# Patient Record
Sex: Female | Born: 1990
Health system: Southern US, Community
[De-identification: ages and names within clinical notes are randomized; demographics above are authoritative.]

## PROBLEM LIST (undated history)

## (undated) ENCOUNTER — Inpatient Hospital Stay (HOSPITAL_COMMUNITY): Payer: Self-pay

## (undated) DIAGNOSIS — Z789 Other specified health status: Secondary | ICD-10-CM

## (undated) DIAGNOSIS — E119 Type 2 diabetes mellitus without complications: Secondary | ICD-10-CM

## (undated) HISTORY — PX: NO PAST SURGERIES: SHX2092

---

## 2016-05-21 ENCOUNTER — Encounter (HOSPITAL_COMMUNITY): Payer: Self-pay | Admitting: Emergency Medicine

## 2016-05-21 ENCOUNTER — Emergency Department (HOSPITAL_COMMUNITY)
Admission: EM | Admit: 2016-05-21 | Discharge: 2016-05-22 | Disposition: A | Discharge status: Home / Self Care | Payer: Self-pay | Attending: Emergency Medicine | Admitting: Emergency Medicine

## 2016-05-21 DIAGNOSIS — K0889 Other specified disorders of teeth and supporting structures: Secondary | ICD-10-CM | POA: Insufficient documentation

## 2016-05-21 DIAGNOSIS — E119 Type 2 diabetes mellitus without complications: Secondary | ICD-10-CM | POA: Insufficient documentation

## 2016-05-21 HISTORY — DX: Type 2 diabetes mellitus without complications: E11.9

## 2016-05-21 NOTE — ED Triage Notes (Signed)
Pt c/o toothaches for 2 days on the right side and has not taken medication because is afraid she may be pregnant and is unsure what she can take. Pt recently had a filling before she moved from [place], states there was an infection but was not given antibiotics.

## 2016-05-22 LAB — PREGNANCY, URINE: PREG TEST UR: NEGATIVE

## 2016-05-22 MED ORDER — HYDROCODONE-ACETAMINOPHEN 5-325 MG PO TABS: 2.0000 | ORAL_TABLET | Freq: Four times a day (QID) | ORAL | 0 refills | Status: DC | PRN

## 2016-05-22 MED ORDER — PENICILLIN V POTASSIUM 500 MG PO TABS: 500.0000 mg | ORAL_TABLET | Freq: Three times a day (TID) | ORAL | 0 refills | Status: AC

## 2016-05-22 MED ORDER — HYDROCODONE-ACETAMINOPHEN 5-325 MG PO TABS
2.0000 | ORAL_TABLET | Freq: Once | ORAL | Status: AC
  Administered 2016-05-22: 2 via ORAL
  Filled 2016-05-22: qty 2

## 2016-05-22 NOTE — ED Provider Notes (Signed)
[facility]-EMERGENCY DEPT Provider Note   CSN: [id number] Arrival date & time: 05/21/16  2221     History   Chief Complaint Chief Complaint  Patient presents with  . Dental Pain    HPI Ashley Payne is a 25 y.o. female with pmh of diabetes who presents with R sided dental pain x 2 days.  Pt recently moved here from [place], she states before she moved her dentist in [place] told her she had two cavities in two teeth on the R side.  Pt has been unable to establish care with a dentist in this area.  She states her pain is moderate, 7/10, and sometimes worse when eating.  Pt able to chew on other side when eating.  Pt denies fevers, drooling, facial swelling, difficulty swallowing, shortness of breath, neck stiffness. Pt states she could be pregnant, she took a pregnancy test which came back "invalid".  Pt has not taken anything for pain because she didn't know if she was pregnant.  HPI  Past Medical History:  Diagnosis Date  . Diabetes mellitus without complication (HCC)     There are no active problems to display for this patient.   History reviewed. No pertinent surgical history.  OB History    No data available       Home Medications    Prior to Admission medications   Medication Sig Start Date End Date Taking? Authorizing Provider  HYDROcodone-acetaminophen (NORCO/VICODIN) 5-325 MG tablet Take 2 tablets by mouth every 6 (six) hours as needed. 05/22/16   Liberty Handylaudia J Gibbons, PA-C  penicillin v potassium (VEETID) 500 MG tablet Take 1 tablet (500 mg total) by mouth 3 (three) times daily. 05/22/16 05/29/16  Liberty Handylaudia J Gibbons, PA-C    Family History No family history on file.  Social History Social History  Substance Use Topics  . Smoking status: Never Smoker  . Smokeless tobacco: Never Used  . Alcohol use No     Allergies   Patient has no allergy information on record.   Review of Systems Review of Systems  Constitutional: Negative for chills and  fever.  HENT: Positive for dental problem. Negative for congestion, drooling, ear pain, facial swelling, sinus pain, sore throat, tinnitus and trouble swallowing.   Respiratory: Negative for cough, choking, chest tightness and shortness of breath.   Cardiovascular: Negative for chest pain.  Gastrointestinal: Negative for abdominal pain and nausea.  Genitourinary: Negative for difficulty urinating.  Musculoskeletal: Negative for arthralgias.  Skin: Negative for rash.  Allergic/Immunologic: Negative for environmental allergies.  Neurological: Negative for speech difficulty and headaches.  Psychiatric/Behavioral: Negative.      Physical Exam Updated Vital Signs BP 127/70 (BP Location: Right Arm)   Pulse 89   Temp 98.4 F (36.9 C) (Oral)   Resp 17   LMP 04/17/2016   SpO2 100%   Physical Exam  Constitutional: She is oriented to person, place, and time. Vital signs are normal. She appears well-developed and well-nourished.  HENT:  Head: Normocephalic and atraumatic.  No tinnitus. Full neck ROM.  No pooling of oral secretions.  No fluctuance or tenderness noted along gum line.  No sublingual swelling. No obvious signs of dental abscess or sublingual enlargement.   Eyes: EOM are normal. Pupils are equal, round, and reactive to light.  Neck: Normal range of motion. No JVD present.  Cardiovascular: Normal rate and regular rhythm.   Pulmonary/Chest: Effort normal and breath sounds normal.  Abdominal: Soft. She exhibits no distension. There is no tenderness.  Musculoskeletal: Normal range of motion.  Lymphadenopathy:    She has no cervical adenopathy.  Neurological: She is alert and oriented to person, place, and time.  Skin: Skin is warm and dry.  Psychiatric: She has a normal mood and affect. Her behavior is normal.  Nursing note and vitals reviewed.    ED Treatments / Results  Labs (all labs ordered are listed, but only abnormal results are displayed) Labs Reviewed  PREGNANCY,  URINE    EKG  EKG Interpretation None       Radiology No results found.  Procedures Procedures (including critical care time)  Medications Ordered in ED Medications  HYDROcodone-acetaminophen (NORCO/VICODIN) 5-325 MG per tablet 2 tablet (2 tablets Oral Given 05/22/16 0109)     Initial Impression / Assessment and Plan / ED Course  I have reviewed the triage vital signs and the nursing notes.  Pertinent labs & imaging results that were available during my care of the patient were reviewed by me and considered in my medical decision making (see chart for details).  Clinical Course    Dental pain associated with dental cary but no signs or symptoms of dental abscess with patient afebrile, non toxic appearing and swallowing secretions well. Exam unconcerning for Ludwig's angina or other deep tissue infection in neck.  Will treat with antibiotic and pain medicine. Urged patient to follow-up with dentist.  I gave patient referral to dentist and stressed the importance of dental follow up for ultimate management of dental pain. Patient voices understanding and is agreeable to plan.  Strict ED return precautions given.  Final Clinical Impressions(s) / ED Diagnoses   Final diagnoses:  Pain, dental    New Prescriptions Discharge Medication List as of 05/22/2016 12:55 AM    START taking these medications   Details  HYDROcodone-acetaminophen (NORCO/VICODIN) 5-325 MG tablet Take 2 tablets by mouth every 6 (six) hours as needed., Starting Thu 05/22/2016, Print    penicillin v potassium (VEETID) 500 MG tablet Take 1 tablet (500 mg total) by mouth 3 (three) times daily., Starting Thu 05/22/2016, Until Thu 05/29/2016, Print         Liberty HandyClaudia J Gibbons, PA-C 05/24/16 2311    Canary Brimhristopher J Tegeler, MD 05/25/16 1114

## 2016-05-22 NOTE — ED Notes (Signed)
Patient d/c;d self care.  F/U and medications reviewed via interpreter.  Patient verbalized understanding.

## 2016-05-22 NOTE — Discharge Instructions (Signed)
Por favor llame al Dr. Russella DarBenitez (dentista) para hacer cita para una evaluacion.    Tome el antibiotico (penicillin v potassiuim) tres veces al dia por los proximos siete dias.    Tome hydrocodone-acetaminophen (norco) cada seis horas para Chief Technology Officerel dolor.  Esta pastilla es para el dolor y puede causar somnolencia asi que por favor no consuma alcohol o maneje cuando tome estas pastillas.

## 2018-05-12 ENCOUNTER — Encounter (HOSPITAL_COMMUNITY): Payer: Self-pay | Admitting: Emergency Medicine

## 2018-05-12 ENCOUNTER — Other Ambulatory Visit: Payer: Self-pay

## 2018-05-12 ENCOUNTER — Emergency Department (HOSPITAL_COMMUNITY)
Admission: EM | Admit: 2018-05-12 | Discharge: 2018-05-13 | Disposition: A | Discharge status: Home / Self Care | Payer: Self-pay | Attending: Emergency Medicine | Admitting: Emergency Medicine

## 2018-05-12 DIAGNOSIS — N926 Irregular menstruation, unspecified: Secondary | ICD-10-CM | POA: Insufficient documentation

## 2018-05-12 DIAGNOSIS — E119 Type 2 diabetes mellitus without complications: Secondary | ICD-10-CM | POA: Insufficient documentation

## 2018-05-12 NOTE — ED Triage Notes (Signed)
Pt requesting pregnancy test, has missed period x 3 mos, home pregnancy tests have been negative.

## 2018-05-12 NOTE — ED Notes (Signed)
Pt reports RLQ pain, hx PCOS but doesn't normally miss periods. Hx miscarriage in May.

## 2018-05-13 LAB — CBC
HEMATOCRIT: 39.1 % (ref 36.0–46.0)
Hemoglobin: 12.5 g/dL (ref 12.0–15.0)
MCH: 29.5 pg (ref 26.0–34.0)
MCHC: 32 g/dL (ref 30.0–36.0)
MCV: 92.2 fL (ref 80.0–100.0)
NRBC: 0 % (ref 0.0–0.2)
PLATELETS: 254 10*3/uL (ref 150–400)
RBC: 4.24 MIL/uL (ref 3.87–5.11)
RDW: 13.2 % (ref 11.5–15.5)
WBC: 8 10*3/uL (ref 4.0–10.5)

## 2018-05-13 LAB — URINALYSIS, ROUTINE W REFLEX MICROSCOPIC
Bilirubin Urine: NEGATIVE
GLUCOSE, UA: NEGATIVE mg/dL
Hgb urine dipstick: NEGATIVE
KETONES UR: NEGATIVE mg/dL
Leukocytes, UA: NEGATIVE
Nitrite: NEGATIVE
PH: 9 — AB (ref 5.0–8.0)
Protein, ur: NEGATIVE mg/dL
Specific Gravity, Urine: 1.015 (ref 1.005–1.030)

## 2018-05-13 LAB — COMPREHENSIVE METABOLIC PANEL
ALBUMIN: 3.9 g/dL (ref 3.5–5.0)
ALK PHOS: 55 U/L (ref 38–126)
ALT: 18 U/L (ref 0–44)
ANION GAP: 10 (ref 5–15)
AST: 18 U/L (ref 15–41)
BILIRUBIN TOTAL: 0.9 mg/dL (ref 0.3–1.2)
BUN: 12 mg/dL (ref 6–20)
CALCIUM: 8.8 mg/dL — AB (ref 8.9–10.3)
CHLORIDE: 105 mmol/L (ref 98–111)
CO2: 22 mmol/L (ref 22–32)
Creatinine, Ser: 0.69 mg/dL (ref 0.44–1.00)
GFR calc non Af Amer: >60 mL/min (ref >60)
GLUCOSE: 101 mg/dL — AB (ref 70–99)
Potassium: 4.1 mmol/L (ref 3.5–5.1)
SODIUM: 137 mmol/L (ref 135–145)
Total Protein: 6.8 g/dL (ref 6.5–8.1)

## 2018-05-13 LAB — LIPASE, BLOOD: Lipase: 29 U/L (ref 11–51)

## 2018-05-13 LAB — I-STAT BETA HCG BLOOD, ED (MC, WL, AP ONLY)

## 2018-05-13 NOTE — ED Provider Notes (Signed)
[facility] Provider Note   CSN: [id number] Arrival date & time: 05/12/18  2312     History   Chief Complaint Chief Complaint  Patient presents with  . Possible Pregnancy  . Abdominal Pain    HPI Ashley Payne is a 27 y.o. female.  Patient presents to the ED with complaint of RLQ abdominal pain that started yesterday. She reports having no menses for the past 3 months but has taken 4 pregnancy tests at home that were negative. No nausea, vomiting, anorexia, diarrhea, vaginal discharge or urinary symptoms. She has no fever.   The history is provided by the patient. No language interpreter was used.  Possible Pregnancy  Associated symptoms include abdominal pain. Pertinent negatives include no chest pain and no shortness of breath.  Abdominal Pain   Pertinent negatives include fever, diarrhea, nausea, vomiting, constipation and dysuria.    Past Medical History:  Diagnosis Date  . Diabetes mellitus without complication (HCC)     There are no active problems to display for this patient.   History reviewed. No pertinent surgical history.   OB History   No obstetric history on file.      Home Medications    Prior to Admission medications   Medication Sig Start Date End Date Taking? Authorizing Provider  HYDROcodone-acetaminophen (NORCO/VICODIN) 5-325 MG tablet Take 2 tablets by mouth every 6 (six) hours as needed. 05/22/16   Liberty HandyGibbons, Claudia J, PA-C    Family History No family history on file.  Social History Social History   Tobacco Use  . Smoking status: Never Smoker  . Smokeless tobacco: Never Used  Substance Use Topics  . Alcohol use: No  . Drug use: Not on file     Allergies   Patient has no known allergies.   Review of Systems Review of Systems  Constitutional: Negative for fever.  Respiratory: Negative for shortness of breath.   Cardiovascular: Negative for chest pain.  Gastrointestinal:  Positive for abdominal pain. Negative for constipation, diarrhea, nausea and vomiting.  Genitourinary: Positive for menstrual problem. Negative for dysuria and vaginal discharge.  Neurological: Negative for weakness and light-headedness.     Physical Exam Updated Vital Signs BP 109/74 (BP Location: Right Arm)   Pulse 73   Temp 98 F (36.7 C) (Oral)   Resp 16   LMP 02/17/2018   SpO2 100%   Physical Exam Constitutional:      Appearance: She is well-developed.  HENT:     Head: Normocephalic.  Neck:     Musculoskeletal: Normal range of motion and neck supple.  Cardiovascular:     Rate and Rhythm: Normal rate and regular rhythm.  Pulmonary:     Effort: Pulmonary effort is normal.     Breath sounds: Normal breath sounds.  Abdominal:     General: Bowel sounds are normal.     Palpations: Abdomen is soft.     Tenderness: There is no abdominal tenderness. There is no guarding or rebound.     Comments: Patient has no producible abdominal tenderness.   Musculoskeletal: Normal range of motion.  Skin:    General: Skin is warm and dry.     Findings: No rash.  Neurological:     Mental Status: She is alert.     Cranial Nerves: No cranial nerve deficit.      ED Treatments / Results  Labs (all labs ordered are listed, but only abnormal results are displayed) Labs Reviewed  COMPREHENSIVE METABOLIC PANEL -  Abnormal; Notable for the following components:      Result Value   Glucose, Bld 101 (*)    Calcium 8.8 (*)    All other components within normal limits  URINALYSIS, ROUTINE W REFLEX MICROSCOPIC - Abnormal; Notable for the following components:   APPearance CLOUDY (*)    pH 9.0 (*)    All other components within normal limits  LIPASE, BLOOD  CBC  I-STAT BETA HCG BLOOD, ED ([facility], [facility], [facility] ONLY)   Results for orders placed or performed during the hospital encounter of 05/12/18  Lipase, blood  Result Value Ref Range   Lipase 29 11 - 51 U/L  Comprehensive metabolic panel    Result Value Ref Range   Sodium 137 135 - 145 mmol/L   Potassium 4.1 3.5 - 5.1 mmol/L   Chloride 105 98 - 111 mmol/L   CO2 22 22 - 32 mmol/L   Glucose, Bld 101 (H) 70 - 99 mg/dL   BUN 12 6 - 20 mg/dL   Creatinine, Ser 8.11 0.44 - 1.00 mg/dL   Calcium 8.8 (L) 8.9 - 10.3 mg/dL   Total Protein 6.8 6.5 - 8.1 g/dL   Albumin 3.9 3.5 - 5.0 g/dL   AST 18 15 - 41 U/L   ALT 18 0 - 44 U/L   Alkaline Phosphatase 55 38 - 126 U/L   Total Bilirubin 0.9 0.3 - 1.2 mg/dL   GFR calc non Af Amer >60 >60 mL/min   GFR calc Af Amer >60 >60 mL/min   Anion gap 10 5 - 15  CBC  Result Value Ref Range   WBC 8.0 4.0 - 10.5 K/uL   RBC 4.24 3.87 - 5.11 MIL/uL   Hemoglobin 12.5 12.0 - 15.0 g/dL   HCT 91.4 78.2 - 95.6 %   MCV 92.2 80.0 - 100.0 fL   MCH 29.5 26.0 - 34.0 pg   MCHC 32.0 30.0 - 36.0 g/dL   RDW 21.3 08.6 - 57.8 %   Platelets 254 150 - 400 K/uL   nRBC 0.0 0.0 - 0.2 %  Urinalysis, Routine w reflex microscopic  Result Value Ref Range   Color, Urine YELLOW YELLOW   APPearance CLOUDY (A) CLEAR   Specific Gravity, Urine 1.015 1.005 - 1.030   pH 9.0 (H) 5.0 - 8.0   Glucose, UA NEGATIVE NEGATIVE mg/dL   Hgb urine dipstick NEGATIVE NEGATIVE   Bilirubin Urine NEGATIVE NEGATIVE   Ketones, ur NEGATIVE NEGATIVE mg/dL   Protein, ur NEGATIVE NEGATIVE mg/dL   Nitrite NEGATIVE NEGATIVE   Leukocytes, UA NEGATIVE NEGATIVE  I-Stat beta hCG blood, ED  Result Value Ref Range   I-stat hCG, quantitative <5.0 <5 mIU/mL   Comment 3            EKG None  Radiology No results found.  Procedures Procedures (including critical care time)  Medications Ordered in ED Medications - No data to display   Initial Impression / Assessment and Plan / ED Course  I have reviewed the triage vital signs and the nursing notes.  Pertinent labs & imaging results that were available during my care of the patient were reviewed by me and considered in my medical decision making (see chart for details).     Patient  with RLQ abdominal pain last night and missed periods x 3 months, reporting negative pregnancy tests at home. She presents tonight because of onset of abdominal pain.  VSS here. Pregnancy test negative, ruling out ectopic pregnancy. She has no reproducible  abdominal tenderness. No leukocytosis, fever, or evidence of UTI. Discussed performing a pelvic exam with the patient. It is not felt necessary given completely nontender exam and normal labs, including negative pregnancy. Feel TOA or PID is unlikely. Patient is comfortable with deferring this exam. Discussed follow up with GYN for further evaluation of missed cycles. Referral made.   Final Clinical Impressions(s) / ED Diagnoses   Final diagnoses:  None   1. menstrual irregularity  ED Discharge Orders    None       Elpidio Anis, PA-C 05/13/18 0740    Ward, Layla Maw, DO 05/13/18 2352

## 2018-05-13 NOTE — ED Notes (Signed)
See downtime charting. 

## 2018-05-17 ENCOUNTER — Telehealth (HOSPITAL_COMMUNITY): Payer: Self-pay | Admitting: Lactation Services

## 2018-05-17 NOTE — Telephone Encounter (Signed)
Returned Patient's call left on Lactation answering machine. Spoke with Pt with assistance of [employer] Phone Spanish Eufaulanterpreter, [place] # [phone]. Pt is not in need of Lactation assistance.   [facility] follow up Gynelogical appt. She was referred to clinic [facility], as she does not currently have a provider.   Message to [facility] to call Pt to schedule follow up appt.

## 2018-05-24 ENCOUNTER — Telehealth: Payer: Self-pay | Admitting: Family Medicine

## 2018-05-24 NOTE — Telephone Encounter (Signed)
Spoke with patient about the appointment date and time.

## 2018-06-10 ENCOUNTER — Encounter: Payer: Self-pay | Admitting: Family Medicine

## 2018-06-15 ENCOUNTER — Encounter (HOSPITAL_COMMUNITY): Payer: Self-pay

## 2018-06-15 ENCOUNTER — Emergency Department (HOSPITAL_COMMUNITY)
Admission: EM | Admit: 2018-06-15 | Discharge: 2018-06-15 | Disposition: A | Discharge status: Home / Self Care | Payer: BLUE CROSS/BLUE SHIELD | Attending: Emergency Medicine | Admitting: Emergency Medicine

## 2018-06-15 DIAGNOSIS — R05 Cough: Secondary | ICD-10-CM | POA: Diagnosis not present

## 2018-06-15 DIAGNOSIS — R059 Cough, unspecified: Secondary | ICD-10-CM

## 2018-06-15 MED ORDER — BENZONATATE 100 MG PO CAPS: 100.0000 mg | ORAL_CAPSULE | Freq: Three times a day (TID) | ORAL | 0 refills | Status: AC

## 2018-06-15 NOTE — Discharge Instructions (Signed)
Compre un expectorante como robitussin o mucinex. Le he recetado pastilla para ayudar con la tos, tambien puede hacer gargaras de sal con agua. Si tiene fiebre tome tylenol. Si tiene dolor de [place], siente que no puede respirar regrese a la sala de Associate Professor.

## 2018-06-15 NOTE — ED Provider Notes (Signed)
[facility]-EMERGENCY DEPT Provider Note   CSN: [id number] Arrival date & time: 06/15/18  1220     History   Chief Complaint Chief Complaint  Patient presents with  . Cough  . Sore Throat    HPI Ashley Payne is a 28 y.o. female.  28 y.o female with no PMH presents to the ED with a chief complaint of sore throat x 1 day.  She reports a sore throat which feels like tickling, along with a dry cough that began yesterday.  Patient has tried DayQuil once and NyQuil once and reports no relieving symptoms at this time.  There is currently a baby in the home who was diagnosed with the flu.  Denies any exacerbating or alleviating factors.  She denies any shortness of breath, chest pain, fevers.     History reviewed. No pertinent past medical history.  There are no active problems to display for this patient.   History reviewed. No pertinent surgical history.   OB History   No obstetric history on file.      Home Medications    Prior to Admission medications   Medication Sig Start Date End Date Taking? Authorizing Provider  benzonatate (TESSALON) 100 MG capsule Take 1 capsule (100 mg total) by mouth every 8 (eight) hours for 5 days. 06/15/18 06/20/18  Claude MangesSoto, Syre Knerr, PA-C  HYDROcodone-acetaminophen (NORCO/VICODIN) 5-325 MG tablet Take 2 tablets by mouth every 6 (six) hours as needed. 05/22/16   Liberty HandyGibbons, Claudia J, PA-C    Family History History reviewed. No pertinent family history.  Social History Social History   Tobacco Use  . Smoking status: Never Smoker  . Smokeless tobacco: Never Used  Substance Use Topics  . Alcohol use: No  . Drug use: Not on file     Allergies   Patient has no known allergies.   Review of Systems Review of Systems  Constitutional: Negative for fever.  HENT: Positive for sore throat.   Respiratory: Positive for cough. Negative for shortness of breath.      Physical Exam Updated Vital Signs BP 134/84 (BP  Location: Left Arm)   Pulse 79   Temp 98.3 F (36.8 C) (Oral)   Resp 17   LMP 05/14/2018   SpO2 100%   Physical Exam Vitals signs and nursing note reviewed.  Constitutional:      General: She is not in acute distress.    Appearance: She is well-developed.  HENT:     Head: Normocephalic and atraumatic.     Mouth/Throat:     Mouth: Mucous membranes are moist.     Pharynx: Uvula midline. Posterior oropharyngeal erythema present. No pharyngeal swelling or oropharyngeal exudate.     Tonsils: No tonsillar exudate.     Comments: Some erythema, no edema, no exudates, no tonsillar abscess present. Eyes:     Pupils: Pupils are equal, round, and reactive to light.  Neck:     Musculoskeletal: Normal range of motion.  Cardiovascular:     Rate and Rhythm: Regular rhythm.     Heart sounds: Normal heart sounds.  Pulmonary:     Effort: Pulmonary effort is normal. No respiratory distress.     Breath sounds: Normal breath sounds.     Comments: Lungs are clear to auscultation no wheezing, rhonchi, rales. Abdominal:     General: Bowel sounds are normal. There is no distension.     Palpations: Abdomen is soft.     Tenderness: There is no abdominal tenderness.  Musculoskeletal:  General: No tenderness or deformity.     Right lower leg: No edema.     Left lower leg: No edema.  Skin:    General: Skin is warm and dry.  Neurological:     Mental Status: She is alert and oriented to person, place, and time.      ED Treatments / Results  Labs (all labs ordered are listed, but only abnormal results are displayed) Labs Reviewed - No data to display  EKG None  Radiology No results found.  Procedures Procedures (including critical care time)  Medications Ordered in ED Medications - No data to display   Initial Impression / Assessment and Plan / ED Course  I have reviewed the triage vital signs and the nursing notes.  Pertinent labs & imaging results that were available during  my care of the patient were reviewed by me and considered in my medical decision making (see chart for details).    Patient presents with a history of cough x1 day.  Denies any previous history of asthma, shortness of breath, chest pain, fevers.  Over-the-counter medication tried without symptomatic relief.  During evaluation lungs are clear to auscultation low suspicion for any pneumonia symptoms began yesterday.  No fevers, low suspicion for any flu patient's vitals are stable, oropharynx is clear with no exudates.  At this time will have patient try some over-the-counter cough suppressant in cough decongestions.  She will also be given a prescription for Tessalon Perles to help with her symptoms.  Return precautions discussed at length with patient in Spanish, no translator was used during this encounter.   Final Clinical Impressions(s) / ED Diagnoses   Final diagnoses:  Cough    ED Discharge Orders         Ordered    benzonatate (TESSALON) 100 MG capsule  Every 8 hours     06/15/18 1252           Claude MangesSoto, Demetri Goshert, PA-C 06/15/18 1255    Derwood KaplanNanavati, Ankit, MD 06/15/18 1701

## 2018-06-15 NOTE — ED Notes (Signed)
Bed: WTR8 Expected date:  Expected time:  Means of arrival:  Comments: 

## 2018-06-15 NOTE — ED Triage Notes (Addendum)
Patient c/o cough and sore throat that started yesterday.  Denies n/v or diarrhea.  A/Ox4

## 2018-06-17 ENCOUNTER — Ambulatory Visit (HOSPITAL_COMMUNITY)
Admission: EM | Admit: 2018-06-17 | Discharge: 2018-06-17 | Disposition: A | Discharge status: Home / Self Care | Payer: BLUE CROSS/BLUE SHIELD | Attending: Emergency Medicine | Admitting: Emergency Medicine

## 2018-06-17 ENCOUNTER — Encounter (HOSPITAL_COMMUNITY): Payer: Self-pay

## 2018-06-17 DIAGNOSIS — J111 Influenza due to unidentified influenza virus with other respiratory manifestations: Secondary | ICD-10-CM | POA: Insufficient documentation

## 2018-06-17 LAB — POCT URINALYSIS DIP (DEVICE)
Bilirubin Urine: NEGATIVE
Glucose, UA: NEGATIVE mg/dL
Leukocytes, UA: NEGATIVE
NITRITE: NEGATIVE
PH: 5.5 (ref 5.0–8.0)
PROTEIN: 30 mg/dL — AB
Specific Gravity, Urine: >=1.03 (ref 1.005–1.030)
UROBILINOGEN UA: 1 mg/dL (ref 0.0–1.0)

## 2018-06-17 LAB — POCT RAPID STREP A: Streptococcus, Group A Screen (Direct): NEGATIVE

## 2018-06-17 MED ORDER — IBUPROFEN 800 MG PO TABS
ORAL_TABLET | ORAL | Status: AC
  Filled 2018-06-17: qty 1

## 2018-06-17 MED ORDER — OSELTAMIVIR PHOSPHATE 75 MG PO CAPS: 75.0000 mg | ORAL_CAPSULE | Freq: Two times a day (BID) | ORAL | 0 refills | Status: DC

## 2018-06-17 MED ORDER — FLUTICASONE PROPIONATE 50 MCG/ACT NA SUSP: 2.0000 | Freq: Every day | NASAL | 0 refills | Status: DC

## 2018-06-17 MED ORDER — IBUPROFEN 800 MG PO TABS
800.0000 mg | ORAL_TABLET | Freq: Once | ORAL | Status: AC
  Administered 2018-06-17: 800 mg via ORAL

## 2018-06-17 MED ORDER — IBUPROFEN 600 MG PO TABS: 600.0000 mg | ORAL_TABLET | Freq: Four times a day (QID) | ORAL | 0 refills | Status: DC | PRN

## 2018-06-17 NOTE — ED Provider Notes (Signed)
HPI  SUBJECTIVE:  Ashley Payne is a 28 y.o. female who presents with 2 days of sore throat, body aches, nasal congestion, rhinorrhea, cough productive of phlegm, mild headache, bilateral ear pain, sinus pain and pressure at night only.  States that she has felt feverish, but does not have a thermometer at home.  She denies postnasal drip, wheezing, chest pain, dyspnea on exertion.  She reports shortness of breath after having a coughing spell only.  No neck stiffness.  No rash.  No vomiting, diarrhea.  She did not get a flu shot this year.  Her stepson currently has the flu.  She tried Tessalon and Tylenol for this with improvement in her symptoms.  No aggravating factors.  She took Tylenol within 4 to 6 hours of evaluation.  No antibiotics in the past month.  She was seen in the ER for this 2 days ago, thought to have a cough, and prescribed Tessalon.  She also reports bilateral ovary pain and dysuria during this time.  No vaginal bleeding, odor, discharge.  No urinary frequency, urgency, cloudy or odorous urine, hematuria.  Past medical history negative for diabetes, hypertension, asthma, smoking, UTI, pyelonephritis.  LMP: December 30.  Denies the possibility being pregnant.  PMD: Dr. Lannette Donath Full.  All history obtained through language line.   History reviewed. No pertinent past medical history.  History reviewed. No pertinent surgical history.  History reviewed. No pertinent family history.  Social History   Tobacco Use  . Smoking status: Never Smoker  . Smokeless tobacco: Never Used  Substance Use Topics  . Alcohol use: No  . Drug use: Not on file    No current facility-administered medications for this encounter.   Current Outpatient Medications:  .  benzonatate (TESSALON) 100 MG capsule, Take 1 capsule (100 mg total) by mouth every 8 (eight) hours for 5 days., Disp: 15 capsule, Rfl: 0 .  fluticasone (FLONASE) 50 MCG/ACT nasal spray, Place 2 sprays into both nostrils  daily., Disp: 16 g, Rfl: 0 .  HYDROcodone-acetaminophen (NORCO/VICODIN) 5-325 MG tablet, Take 2 tablets by mouth every 6 (six) hours as needed., Disp: 12 tablet, Rfl: 0 .  ibuprofen (ADVIL,MOTRIN) 600 MG tablet, Take 1 tablet (600 mg total) by mouth every 6 (six) hours as needed., Disp: 30 tablet, Rfl: 0 .  oseltamivir (TAMIFLU) 75 MG capsule, Take 1 capsule (75 mg total) by mouth 2 (two) times daily. X 5 days, Disp: 10 capsule, Rfl: 0  No Known Allergies   ROS  As noted in HPI.   Physical Exam  BP 123/69 (BP Location: Right Arm)   Pulse (!) 110   Temp (!) 101.4 F (38.6 C) (Oral)   Resp 16   LMP 05/31/2018   SpO2 100%   Constitutional: Well developed, well nourished, no acute distress Eyes: PERRL, EOMI, conjunctiva normal bilaterally HENT: Normocephalic, atraumatic,mucus membranes moist.  TMs normal bilaterally.  Positive extensive clear rhinorrhea, erythematous, swollen turbinates.  No maxillary, frontal sinus tenderness.  Normal oropharynx, normal tonsils without exudates.  No obvious postnasal drip. Neck: Positive anterior cervical lymphadenopathy.  No meningismus. Respiratory: Clear to auscultation bilaterally, no rales, no wheezing, no rhonchi Cardiovascular: Regular tachycardia, no murmurs, no gallops, no rubs GI: Soft, nondistended, normal bowel sounds, no rebound, no guarding.  Mild left lower quadrant tenderness with deep palpation.  No suprapubic, flank tenderness.  Tap table test negative. Back: no CVAT skin: No rash, skin intact Musculoskeletal: No edema, no tenderness, no deformities Neurologic: Alert & oriented x 3,  CN III-XII grossly intact, no motor deficits, sensation grossly intact Psychiatric: Speech and behavior appropriate   ED Course   Medications  ibuprofen (ADVIL,MOTRIN) tablet 800 mg (800 mg Oral Given 06/17/18 1556)    Orders Placed This Encounter  Procedures  . Culture, group A strep (throat)    Standing Status:   Standing    Number of  Occurrences:   1  . Urine culture    Standing Status:   Standing    Number of Occurrences:   1  . POCT rapid strep A [facility])    Standing Status:   Standing    Number of Occurrences:   1  . POCT urinalysis dip (device)    Standing Status:   Standing    Number of Occurrences:   1   Results for orders placed or performed during the hospital encounter of 06/17/18 (from the past 24 hour(s))  POCT rapid strep A [facility])     Status: None   Collection Time: 06/17/18  3:40 PM  Result Value Ref Range   Streptococcus, Group A Screen (Direct) NEGATIVE NEGATIVE  POCT urinalysis dip (device)     Status: Abnormal   Collection Time: 06/17/18  3:59 PM  Result Value Ref Range   Glucose, UA NEGATIVE NEGATIVE mg/dL   Bilirubin Urine NEGATIVE NEGATIVE   Ketones, ur TRACE (A) NEGATIVE mg/dL   Specific Gravity, Urine >=1.030 1.005 - 1.030   Hgb urine dipstick TRACE (A) NEGATIVE   pH 5.5 5.0 - 8.0   Protein, ur 30 (A) NEGATIVE mg/dL   Urobilinogen, UA 1.0 0.0 - 1.0 mg/dL   Nitrite NEGATIVE NEGATIVE   Leukocytes, UA NEGATIVE NEGATIVE   No results found.  ED Clinical Impression  Influenza   ED Assessment/Plan  Rapid strep negative.  However given the dysuria will check a UA.  I suspect the flu.  Giving ibuprofen 800 mg p.o. here for fever.  No evidence of pneumonia, meningitis, otitis, intra-abdominal process, surgical abdomen today.  Home with Tamiflu, ibuprofen 600 mg combined with 1 g of Tylenol together 3 or 4 times a day, push fluids, Mucinex D, Flonase, work note for 3 days.  UA negative for UTI.  Urine concentrated, with trace hematuria, ketones, and protein.  Will send off for culture to confirm absence of UTI.  Plan as above.  Spent 25 minutes with the patient using the language line, performing history, physical, explain medical decision making, treatment plan and plan for follow-up with patient.  Using the language line , discussed labs, MDM, treatment plan, and plan  for follow-up with patient Discussed sn/sx that should prompt return to the ED. patient agrees with plan.   Meds ordered this encounter  Medications  . ibuprofen (ADVIL,MOTRIN) tablet 800 mg  . oseltamivir (TAMIFLU) 75 MG capsule    Sig: Take 1 capsule (75 mg total) by mouth 2 (two) times daily. X 5 days    Dispense:  10 capsule    Refill:  0  . fluticasone (FLONASE) 50 MCG/ACT nasal spray    Sig: Place 2 sprays into both nostrils daily.    Dispense:  16 g    Refill:  0  . ibuprofen (ADVIL,MOTRIN) 600 MG tablet    Sig: Take 1 tablet (600 mg total) by mouth every 6 (six) hours as needed.    Dispense:  30 tablet    Refill:  0    *This clinic note was created using Scientist, clinical (histocompatibility and immunogenetics)Dragon dictation software. Therefore, there may be  occasional mistakes despite careful proofreading.  ?   Domenick Gong, MD 06/18/18 [phone]

## 2018-06-17 NOTE — Discharge Instructions (Addendum)
Your strep was negative and your urine is negative for urinary tract infection.  I am sending your urine off for culture to make absolutely sure that you do not have a urinary tract infection.  We will call you if it comes back positive for a urinary tract infection.  Finish the Tamiflu, take the ibuprofen 600 mg combined with 1 g of Tylenol together 3 or 4 times a day, push fluids, Mucinex D, Flonase.

## 2018-06-17 NOTE — ED Triage Notes (Signed)
Pt present coughing and sore throat, symptoms started 2 days ago.  Pt states she has tried OTC no relief

## 2018-06-18 LAB — URINE CULTURE: CULTURE: NO GROWTH

## 2018-06-20 LAB — CULTURE, GROUP A STREP (THRC)

## 2018-07-07 ENCOUNTER — Ambulatory Visit: Payer: BLUE CROSS/BLUE SHIELD | Attending: Family Medicine | Admitting: Family Medicine

## 2018-07-07 ENCOUNTER — Encounter: Payer: Self-pay | Admitting: Family Medicine

## 2018-07-07 VITALS — BP 103/69 | HR 67 | Temp 98.6°F | Resp 18 | Ht 63.0 in | Wt 157.0 lb

## 2018-07-07 DIAGNOSIS — R5383 Other fatigue: Secondary | ICD-10-CM

## 2018-07-07 DIAGNOSIS — R1013 Epigastric pain: Secondary | ICD-10-CM

## 2018-07-07 MED ORDER — OMEPRAZOLE 40 MG PO CPDR: 40.0000 mg | DELAYED_RELEASE_CAPSULE | Freq: Every day | ORAL | 3 refills | Status: DC

## 2018-07-07 MED FILL — OMEPRAZOLE DR 40 MG CAPSULE: 40 | 30 days supply | Qty: 30 | Fill #0

## 2018-07-07 NOTE — Patient Instructions (Signed)
Enfermedad de reflujo gastroesofgico en los adultos  Gastroesophageal Reflux Disease, Adult  El reflujo gastroesofgico (RGE) ocurre cuando el cido del estmago sube por el tubo que conecta la boca con el estmago (esfago). Normalmente, la comida baja por el esfago y se mantiene en el estmago, donde se la digiere. Cuando una persona tiene RGE, los alimentos y el cido estomacal suelen volver al esfago. Usted puede tener una enfermedad llamada enfermedad de reflujo gastroesofgico (ERGE) si el reflujo:   Sucede a menudo.   Causa sntomas frecuentes o muy intensos.   Causa problemas tales como dao en el esfago.  Cuando esto ocurre, el esfago duele y se hincha (inflama). Con el tiempo, la ERGE puede ocasionar pequeos agujeros (lceras) en el revestimiento del esfago.  Cules son las causas?  Esta afeccin se debe a un problema en el msculo que se encuentra entre el esfago y el estmago. Cuando este msculo est dbil o no es normal, no se cierra correctamente para impedir que los alimentos y el cido regresen del estmago. El msculo puede debilitarse debido a lo siguiente:   El consumo de tabaco.   Embarazo.   Tener cierto tipo de hernia (hernia de hiato).   Consumo de alcohol.   Ciertos alimentos y bebidas, como caf, chocolate, cebollas y menta.  Qu incrementa el riesgo?  Es ms probable que tenga esta afeccin si:   Tiene sobrepeso.   Tiene una enfermedad que afecta el tejido conjuntivo.   Usa antiinflamatorios no esteroideos (AINE).  Cules son los signos o los sntomas?  Los sntomas de esta afeccin incluyen:   Acidez estomacal.   Dificultad o dolor al tragar.   Sensacin de tener un bulto en la garganta.   Sabor amargo en la boca.   Mal aliento.   Tener una gran cantidad de saliva.   Estmago inflamado o con malestar.   Eructos.   Dolor en el pecho. El dolor de pecho puede deberse a distintas afecciones. Asegrese de consultar a su mdico si tiene dolor en el pecho.   Falta  de aire o respiracin ruidosa (sibilancias).   Tos constante (crnica) o durante la noche.   Desgaste de la superficie de los dientes (esmalte dental).   Prdida de peso.  Cmo se trata?  El tratamiento depender de la gravedad de los sntomas. El mdico puede sugerirle lo siguiente:   Cambios en la dieta.   Medicamentos.   Una ciruga.  Siga estas indicaciones en su casa:  Comida y bebida     Siga una dieta como se lo haya indicado el mdico. Es posible que deba evitar alimentos y bebidas, por ejemplo:  ? Caf y t (con o sin cafena).  ? Bebidas que contengan alcohol.  ? Bebidas energticas y deportivas.  ? Bebidas gaseosas y refrescos.  ? Chocolate y cacao.  ? Menta y esencia de menta.  ? Ajo y cebolla.  ? Rbano picante.  ? Alimentos cidos y condimentados. Estos incluyen todos los tipos de pimientos, chile en polvo, curry en polvo, vinagre, salsas picantes y salsa barbacoa.  ? Ctricos y sus jugos, por ejemplo, naranjas, limones y limas.  ? Alimentos que contengan tomate. Estos incluyen salsa roja, chile, salsa picante y pizza con salsa de tomate.  ? Alimentos fritos y grasos. Estos incluyen donas, papas fritas, papitas fritas de bolsa y aderezos con alto contenido de grasa.  ? Carnes con alto contenido de grasa. Estas incluye los perros calientes, chuletas o costillas, embutidos, jamn y tocino.  ?   Productos lcteos ricos en grasas, como leche entera, manteca y queso crema.   Consuma pequeas cantidades de comida con ms frecuencia. Evite consumir porciones abundantes.   Evite beber grandes cantidades de lquidos con las comidas.   Evite comer 2 o 3horas antes de acostarse.   Evite recostarse inmediatamente despus de comer.   No haga ejercicios enseguida despus de comer.  Estilo de vida     No consuma ningn producto que contenga nicotina o tabaco. Estos incluyen cigarrillos, cigarrillos electrnicos y tabaco para mascar. Si necesita ayuda para dejar de fumar, consulte al mdico.   Intente  reducir el nivel de estrs. Si necesita ayuda para hacer esto, consulte al mdico.   Si tiene sobrepeso, baje una cantidad de peso saludable para usted. Consulte a su mdico para bajar de peso de manera segura.  Indicaciones generales   Est atento a cualquier cambio en los sntomas.   Tome los medicamentos de venta libre y los recetados solamente como se lo haya indicado el mdico. No tome aspirina, ibuprofeno ni otros AINE a menos que el mdico lo autorice.   Use ropa holgada. No use nada apretado alrededor de la cintura.   Levante (eleve) la cabecera de la cama aproximadamente 6pulgadas (15cm).   Evite inclinarse si al hacerlo empeoran los sntomas.   Concurra a todas las visitas de seguimiento como se lo haya indicado el mdico. Esto es importante.  Comunquese con un mdico si:   Aparecen nuevos sntomas.   Adelgaza y no sabe por qu.   Tiene problemas para tragar o le duele cuando traga.   Tiene sibilancias o tos persistente.   Los sntomas no mejoran con el tratamiento.   Tiene la voz ronca.  Solicite ayuda inmediatamente si:   Siente dolor en los brazos, el cuello, la mandbula, los dientes o la espalda.   Se siente transpirado, mareado o tiene una sensacin de desvanecimiento.   Siente falta de aire o dolor en el pecho.   Vomita y el vmito tiene un aspecto similar a la sangre o a los posos de caf.   Pierde el conocimiento (se desmaya).   Las deposiciones (heces) son sanguinolentas o negras.   No puede tragar, beber o comer.  Resumen   Si una persona tiene enfermedad de reflujo gastroesofgico (ERGE), los alimentos y el cido estomacal suben al esfago y causan sntomas o problemas tales como dao en el esfago.   El tratamiento depender de la gravedad de los sntomas.   Siga una dieta como se lo haya indicado el mdico.   Tome todos los medicamentos solamente como se lo haya indicado el mdico.  Esta informacin no tiene como fin reemplazar el consejo del mdico. Asegrese de  hacerle al mdico cualquier pregunta que tenga.  Document Released: 06/21/2010 Document Revised: 12/31/2017 Document Reviewed: 12/31/2017  Elsevier Interactive Patient Education  2019 [employer] Inc[employer]

## 2018-07-07 NOTE — Progress Notes (Signed)
Subjective:    Patient ID: Ashley Payne, female    DOB: 1990-08-05, 28 y.o.   MRN: [id number]   Due to a language barrier, patient is accompanied by live interpreter at today's visit  HPI       28 yo female new to the practice who is s/p recent ED visit on 06/15/2018 and 06/17/2018 due to cough, sore throat and ear pain.  Patient was eventually diagnosed with influenza.  Patient states that her cough, sore throat and ear pain have now resolved however she continues to have issues with some mild fatigue.  Patient also with complaint of occasional issues with acid reflux after spicy foods such as salsa.  Patient states that she will occasionally get some mild nausea and burping/belching.  Patient is currently on Zantac 150 mg once daily but she states that this does not seem to control of her symptoms.      On review of systems, patient reports that she has had some constipation.  No blood in the stool and no black stools.  No current abdominal pain.  Patient denies any chest pain or palpitations, no shortness of breath or cough.  No current urinary frequency, urgency or dysuria.  Patient reports that in the emergency department, she was having some lower abdominal cramping and urinalysis was done.  She now believes that that abdominal cramping was secondary to onset of her menses as patient states that she went through a 60-month period where she did not have her menses and then menses restarted and now becoming more regular.  When she was in the emergency department however her menses was 4 days late and this is why urine pregnancy test was done and UA.       Patient reports no known drug, food or insect sting allergies.  Patient reports no significant past medical history other than prior gastritis/heartburn for which she was placed on Zantac 150 mg once daily.  Patient however does not believe that this medication is currently effective enough.  Patient's family history is significant for mother  with asthma, maternal grandmother with breast cancer with onset later in life and dad with history of hypoglycemia.  Patient is married and does not smoke or use tobacco products and does not drink alcohol.   Review of Systems  Constitutional: Positive for fatigue. Negative for chills and fever.  HENT: Negative for congestion, ear pain, postnasal drip, sore throat and trouble swallowing.   Respiratory: Negative for cough and shortness of breath.   Cardiovascular: Negative for chest pain, palpitations and leg swelling.  Gastrointestinal: Positive for constipation and nausea. Negative for abdominal pain, blood in stool and diarrhea.  Endocrine: Negative for polydipsia, polyphagia and polyuria.  Genitourinary: Positive for menstrual problem (patient had absent menses x 4 months but has now restarted and becoming regular again). Negative for dysuria and frequency.  Allergic/Immunologic: Negative for environmental allergies, food allergies and immunocompromised state.  Neurological: Negative for dizziness and headaches.  Hematological: Negative for adenopathy. Does not bruise/bleed easily.       Objective:   Physical Exam BP 103/69 (BP Location: Right Arm, Patient Position: Sitting, Cuff Size: Normal)   Pulse 67   Temp 98.6 F (37 C) (Oral)   Resp 18   Ht 5\' 3"  (1.6 m)   Wt 157 lb (71.2 kg)   LMP 07/01/2018   SpO2 99%   BMI 27.81 kg/m  Nurse's notes and vital signs reviewed General-well-nourished, well-developed female in no acute distress.  Patient  is accompanied by her husband and a live interpreter at today's visit ENT- TM slightly dull, mild edema of the nasal turbinates, normal oropharynx Neck-supple, no lymphadenopathy, no thyromegaly Lungs-clear to auscultation bilaterally Cardiovascular-regular rate and rhythm Abdomen-soft, nontender, no rebound or guarding Back-no CVA tenderness Extremities-no edema       Assessment & Plan:  1. Dyspepsia Patient with dyspepsia for  which she is currently on Zantac without complete relief.  Patient was given handout as part of AVS on GERD.  Patient will be placed on omeprazole 40 mg daily.  Patient should also avoid known trigger foods or foods that are spicy/greasy.  Patient has also been asked to return to clinic in 2 to 3 months for reevaluation and at that time we will likely decrease the omeprazole dose to 20 mg if patient is symptom-free.  Patient should call or return sooner if she is having symptoms or issues tolerating the medication - omeprazole (PRILOSEC) 40 MG capsule; Take 1 capsule (40 mg total) by mouth daily. To decrease stomach acid  Dispense: 30 capsule; Refill: 3  2. Fatigue, unspecified type Patient with complaint of mild fatigue status post influenza.  Patient should make sure that she is well rested and remain well-hydrated.  Patient will have CBC to look for anemia, complete metabolic panel to look for electrolyte or liver enzyme abnormality and TSH and free T4 to look for thyroid abnormality which may be contributing to her fatigue. - CBC with Differential - Comprehensive metabolic panel - TSH + free T4  An After Visit Summary was printed and given to the patient.  Return in about 3 months (around 10/05/2018) for dyspepsia/fatigue.

## 2018-07-08 LAB — COMPREHENSIVE METABOLIC PANEL WITH GFR
ALT: 16 IU/L (ref 0–32)
AST: 17 IU/L (ref 0–40)
Albumin/Globulin Ratio: 1.6 (ref 1.2–2.2)
Albumin: 4.2 g/dL (ref 3.9–5.0)
Alkaline Phosphatase: 72 IU/L (ref 39–117)
BUN/Creatinine Ratio: 17 (ref 9–23)
BUN: 11 mg/dL (ref 6–20)
Bilirubin Total: 0.4 mg/dL (ref 0.0–1.2)
CO2: 23 mmol/L (ref 20–29)
Calcium: 9.3 mg/dL (ref 8.7–10.2)
Chloride: 101 mmol/L (ref 96–106)
Creatinine, Ser: 0.65 mg/dL (ref 0.57–1.00)
GFR calc Af Amer: 141 mL/min/1.73
GFR calc non Af Amer: 122 mL/min/1.73
Globulin, Total: 2.6 g/dL (ref 1.5–4.5)
Glucose: 86 mg/dL (ref 65–99)
Potassium: 3.6 mmol/L (ref 3.5–5.2)
Sodium: 139 mmol/L (ref 134–144)
Total Protein: 6.8 g/dL (ref 6.0–8.5)

## 2018-07-08 LAB — CBC WITH DIFFERENTIAL/PLATELET
Basophils Absolute: 0.1 x10E3/uL (ref 0.0–0.2)
Basos: 1 %
EOS (ABSOLUTE): 0.2 x10E3/uL (ref 0.0–0.4)
Eos: 3 %
Hematocrit: 37.8 % (ref 34.0–46.6)
Hemoglobin: 12.4 g/dL (ref 11.1–15.9)
Immature Grans (Abs): 0.1 x10E3/uL (ref 0.0–0.1)
Immature Granulocytes: 1 %
Lymphocytes Absolute: 1.9 x10E3/uL (ref 0.7–3.1)
Lymphs: 26 %
MCH: 29.9 pg (ref 26.6–33.0)
MCHC: 32.8 g/dL (ref 31.5–35.7)
MCV: 91 fL (ref 79–97)
Monocytes Absolute: 0.6 x10E3/uL (ref 0.1–0.9)
Monocytes: 9 %
Neutrophils Absolute: 4.5 x10E3/uL (ref 1.4–7.0)
Neutrophils: 60 %
Platelets: 307 x10E3/uL (ref 150–450)
RBC: 4.15 x10E6/uL (ref 3.77–5.28)
RDW: 13 % (ref 11.7–15.4)
WBC: 7.3 x10E3/uL (ref 3.4–10.8)

## 2018-07-08 LAB — TSH+FREE T4
Free T4: 1.17 ng/dL (ref 0.82–1.77)
TSH: 2.8 u[IU]/mL (ref 0.450–4.500)

## 2018-07-09 ENCOUNTER — Telehealth: Payer: Self-pay | Admitting: *Deleted

## 2018-07-09 NOTE — Telephone Encounter (Signed)
Medical Assistant used [employer] to contact patient.  Interpreter Name: Dalphine Handing #: [id number] Patient was not available, [employer] left patient a voicemail. Voicemail states to give a call back to [place] with [facility] at [phone]. Please let patient know labs are normal.

## 2018-07-09 NOTE — Telephone Encounter (Signed)
-----   Message from Cain Saupe, MD sent at 07/08/2018 11:35 PM EST ----- Please notify patient that her CBC. CMET and TSH were normal

## 2018-07-19 ENCOUNTER — Telehealth: Payer: Self-pay | Admitting: *Deleted

## 2018-07-19 NOTE — Telephone Encounter (Signed)
Pt name and DOB verified. Patient aware of results and result note per Dr. Jillyn Hidden. Results translated by  [place] for assistance.

## 2018-08-25 ENCOUNTER — Encounter (HOSPITAL_COMMUNITY): Payer: Self-pay | Admitting: Emergency Medicine

## 2018-08-25 ENCOUNTER — Ambulatory Visit (HOSPITAL_COMMUNITY)
Admission: EM | Admit: 2018-08-25 | Discharge: 2018-08-25 | Disposition: A | Discharge status: Home / Self Care | Payer: BLUE CROSS/BLUE SHIELD | Attending: Family Medicine | Admitting: Family Medicine

## 2018-08-25 ENCOUNTER — Other Ambulatory Visit: Payer: Self-pay

## 2018-08-25 DIAGNOSIS — J039 Acute tonsillitis, unspecified: Secondary | ICD-10-CM

## 2018-08-25 MED ORDER — AMOXICILLIN 500 MG PO CAPS: 500.0000 mg | ORAL_CAPSULE | Freq: Two times a day (BID) | ORAL | 0 refills | Status: AC

## 2018-08-25 NOTE — Discharge Instructions (Signed)
Sore Throat  Toma amoxicllin 2 veces cada dia por 10 dias  Please continue Tylenol or Ibuprofen for fever and pain. May try salt water gargles, cepacol lozenges, throat spray, or OTC cold relief medicine for throat discomfort. If you also have congestion take a daily anti-histamine like Zyrtec, Claritin, and a oral decongestant to help with post nasal drip that may be irritating your throat.   Stay hydrated and drink plenty of fluids to keep your throat coated relieve irritation.

## 2018-08-25 NOTE — ED Notes (Signed)
Pt discharged by provider.

## 2018-08-25 NOTE — ED Triage Notes (Signed)
[employer] Interpreter: Byrd Hesselbach [phone]  Pt complains of nasal congestion and sore throat x2 days.   She denies any fever, cough or SOB.  Pt has been gargling with warm salt water.

## 2018-08-26 NOTE — ED Provider Notes (Signed)
[facility]    CSN: [id number] Arrival date & time: 08/25/18  1930     History   Chief Complaint Chief Complaint  Patient presents with  . URI    HPI Ashley Payne is a 28 y.o. female no significant past medical history presenting today for evaluation of sore throat.  Patient states that she has had sore throat for the past couple days.  She mainly feels swelling in her tonsils and her upper neck.  Denies associated nasal congestion, cough.  Denies any fevers.  She has tried salt water gargles, but no over-the-counter medicines. Denies any recent travel, denies any known exposure to COVID-19.  Denies close contacts with similar symptoms.  HPI  History reviewed. No pertinent past medical history.  There are no active problems to display for this patient.   History reviewed. No pertinent surgical history.  OB History   No obstetric history on file.      Home Medications    Prior to Admission medications   Medication Sig Start Date End Date Taking? Authorizing Provider  acetaminophen (TYLENOL) 325 MG tablet Take 650 mg by mouth every 6 (six) hours as needed for mild pain.   Yes [provider]  amoxicillin (AMOXIL) 500 MG capsule Take 1 capsule (500 mg total) by mouth 2 (two) times daily for 10 days. 08/25/18 09/04/18  Wieters, Hallie C, PA-C  ibuprofen (ADVIL,MOTRIN) 600 MG tablet Take 1 tablet (600 mg total) by mouth every 6 (six) hours as needed. 06/17/18   Domenick Gong, MD  omeprazole (PRILOSEC) 40 MG capsule Take 1 capsule (40 mg total) by mouth daily. To decrease stomach acid 07/07/18   Fulp, Hewitt Shorts, MD    Family History History reviewed. No pertinent family history.  Social History Social History   Tobacco Use  . Smoking status: Never Smoker  . Smokeless tobacco: Never Used  Substance Use Topics  . Alcohol use: No  . Drug use: Never     Allergies   Patient has no known allergies.   Review of Systems Review of Systems   Constitutional: Negative for activity change, appetite change, chills, fatigue and fever.  HENT: Positive for sore throat. Negative for congestion, ear pain, rhinorrhea, sinus pressure and trouble swallowing.   Eyes: Negative for discharge and redness.  Respiratory: Negative for cough, chest tightness and shortness of breath.   Cardiovascular: Negative for chest pain.  Gastrointestinal: Negative for abdominal pain, diarrhea, nausea and vomiting.  Musculoskeletal: Negative for myalgias.  Skin: Negative for rash.  Neurological: Negative for dizziness, light-headedness and headaches.     Physical Exam Triage Vital Signs ED Triage Vitals  Enc Vitals Group     BP 08/25/18 2002 (!) 119/54     Pulse Rate 08/25/18 2002 92     Resp --      Temp 08/25/18 2002 99.5 F (37.5 C)     Temp Source 08/25/18 2002 Oral     SpO2 08/25/18 2002 100 %     Weight --      Height --      Head Circumference --      Peak Flow --      Pain Score 08/25/18 2000 4     Pain Loc --      Pain Edu? --      Excl. in GC? --    No data found.  Updated Vital Signs BP (!) 119/54 (BP Location: Left Arm)   Pulse 92   Temp 99.5 F (37.5  C) (Oral)   LMP 08/25/2018 (Exact Date)   SpO2 100%   Visual Acuity Right Eye Distance:   Left Eye Distance:   Bilateral Distance:    Right Eye Near:   Left Eye Near:    Bilateral Near:     Physical Exam Vitals signs and nursing note reviewed.  Constitutional:      General: She is not in acute distress.    Appearance: She is well-developed.  HENT:     Head: Normocephalic and atraumatic.     Ears:     Comments: Bilateral ears without tenderness to palpation of external auricle, tragus and mastoid, EAC's without erythema or swelling, TM's with good bony landmarks and cone of light. Non erythematous.     Nose:     Comments: Nasal mucosa pink without rhinorrhea    Mouth/Throat:     Comments: Bilateral tonsils moderately enlarged, slight erythema with exudate  bilaterally, posterior pharynx patent, no uvula swelling or deviation, no soft palate swelling Eyes:     Conjunctiva/sclera: Conjunctivae normal.  Neck:     Musculoskeletal: Neck supple.  Cardiovascular:     Rate and Rhythm: Normal rate and regular rhythm.     Heart sounds: No murmur.  Pulmonary:     Effort: Pulmonary effort is normal. No respiratory distress.     Breath sounds: Normal breath sounds.     Comments: Breathing comfortably at rest, CTABL, no wheezing, rales or other adventitious sounds auscultated Abdominal:     Palpations: Abdomen is soft.     Tenderness: There is no abdominal tenderness.  Skin:    General: Skin is warm and dry.  Neurological:     Mental Status: She is alert.      UC Treatments / Results  Labs (all labs ordered are listed, but only abnormal results are displayed) Labs Reviewed - No data to display  EKG None  Radiology No results found.  Procedures Procedures (including critical care time)  Medications Ordered in UC Medications - No data to display  Initial Impression / Assessment and Plan / UC Course  I have reviewed the triage vital signs and the nursing notes.  Pertinent labs & imaging results that were available during my care of the patient were reviewed by me and considered in my medical decision making (see chart for details).     Patient with low-grade fever and tonsillitis, will treat as such with amoxicillin twice daily x10 days.  Discussed further symptomatic and supportive care.  Continue to monitor,Discussed strict return precautions. Patient verbalized understanding and is agreeable with plan.  Final Clinical Impressions(s) / UC Diagnoses   Final diagnoses:  Acute tonsillitis, unspecified etiology     Discharge Instructions     Sore Throat  Toma amoxicllin 2 veces cada dia por 10 dias  Please continue Tylenol or Ibuprofen for fever and pain. May try salt water gargles, cepacol lozenges, throat spray, or OTC cold  relief medicine for throat discomfort. If you also have congestion take a daily anti-histamine like Zyrtec, Claritin, and a oral decongestant to help with post nasal drip that may be irritating your throat.   Stay hydrated and drink plenty of fluids to keep your throat coated relieve irritation.    ED Prescriptions    Medication Sig Dispense Auth. Provider   amoxicillin (AMOXIL) 500 MG capsule Take 1 capsule (500 mg total) by mouth 2 (two) times daily for 10 days. 20 capsule Wieters, [place] C, PA-C     Controlled Substance Prescriptions [place]  Controlled Substance Registry consulted? Not Applicable   Lew Dawes, [place] 08/26/18 1008

## 2018-10-05 ENCOUNTER — Ambulatory Visit: Payer: BLUE CROSS/BLUE SHIELD | Admitting: Family Medicine

## 2018-10-06 ENCOUNTER — Ambulatory Visit: Payer: Self-pay | Attending: Family Medicine | Admitting: Physician Assistant

## 2018-10-06 ENCOUNTER — Other Ambulatory Visit: Payer: Self-pay

## 2018-10-06 DIAGNOSIS — Z789 Other specified health status: Secondary | ICD-10-CM

## 2018-10-06 DIAGNOSIS — J029 Acute pharyngitis, unspecified: Secondary | ICD-10-CM

## 2018-10-06 MED ORDER — AZITHROMYCIN 250 MG PO TABS: ORAL_TABLET | ORAL | 0 refills | Status: DC

## 2018-10-06 NOTE — Progress Notes (Signed)
Patient ID: Ashley Payne, female   DOB: 07-Nov-1990, 28 y.o.   MRN: [id number] Virtual Visit via Telephone Note  I connected with [name] on 10/06/18 at  3:50 PM EDT by telephone and verified that I am speaking with the correct person using two identifiers.   I discussed the limitations, risks, security and privacy concerns of performing an evaluation and management service by telephone and the availability of in person appointments. I also discussed with the patient that there may be a patient responsible charge related to this service. The patient expressed understanding and agreed to proceed.  Patient location:  home My Location:  [facility] office Persons on the call:  Myself, interpreter, and the patient  History of Present Illness:   Has a recurrent ST since ED visit in March.  Feels as though she needs another round of antibiotics.  No fever.  No body aches.  Symptoms now for about 5 days.  No runny nose/dry cough/other s/sx of Covid-19.  No recent travel.    Observations/Objective:  TP linear.  Speech is clear   Assessment and Plan: 1. Sore throat Zpack, salt water gargles.  Covid-precautions.    2. Language barrier pacific interpreters used and additional time performing visit was required.    Follow Up Instructions: prn   I discussed the assessment and treatment plan with the patient. The patient was provided an opportunity to ask questions and all were answered. The patient agreed with the plan and demonstrated an understanding of the instructions.   The patient was advised to call back or seek an in-person evaluation if the symptoms worsen or if the condition fails to improve as anticipated.  I provided 8 minutes of non-face-to-face time during this encounter.   Georgian Co, PA-C

## 2018-10-06 NOTE — Progress Notes (Signed)
Patient was called and identified by name and date of birth.  Patient would like a refill of antibiotic given for her tonsillitis.

## 2019-04-12 ENCOUNTER — Other Ambulatory Visit: Payer: Self-pay

## 2019-04-12 DIAGNOSIS — Z20822 Contact with and (suspected) exposure to covid-19: Secondary | ICD-10-CM

## 2019-04-13 LAB — NOVEL CORONAVIRUS, NAA: SARS-CoV-2, NAA: NOT DETECTED

## 2019-04-15 ENCOUNTER — Encounter (HOSPITAL_COMMUNITY): Payer: Self-pay

## 2019-04-15 ENCOUNTER — Other Ambulatory Visit: Payer: Self-pay

## 2019-04-15 ENCOUNTER — Ambulatory Visit (HOSPITAL_COMMUNITY)
Admission: EM | Admit: 2019-04-15 | Discharge: 2019-04-15 | Disposition: A | Discharge status: Home / Self Care | Payer: Medicaid Other | Attending: Urgent Care | Admitting: Urgent Care

## 2019-04-15 DIAGNOSIS — Z3A01 Less than 8 weeks gestation of pregnancy: Secondary | ICD-10-CM

## 2019-04-15 DIAGNOSIS — Z3201 Encounter for pregnancy test, result positive: Secondary | ICD-10-CM

## 2019-04-15 LAB — POCT PREGNANCY, URINE: Preg Test, Ur: POSITIVE — AB

## 2019-04-15 MED ORDER — PRENATAL COMPLETE 14-0.4 MG PO TABS: 1.0000 | ORAL_TABLET | Freq: Every day | ORAL | 0 refills | Status: DC

## 2019-04-15 NOTE — ED Provider Notes (Signed)
[facility]    CSN: [id number] Arrival date & time: 04/15/19  0857      History   Chief Complaint Chief Complaint  Patient presents with  . Possible Pregnancy    HPI Ashley Payne is a 28 y.o. female.   Ashley Payne presents with requests for pregnancy confirmation. She has had one other pregnancy which resulted in miscarriage. She has some cramping, no vaginal bleeding. Took two pregnancy tests at home which were positive yesterday. LMP 03/13/19. She was trying for pregnancy. She is not otherwise on any medications. Doesn't smoke. She works in a [employer] and does regular physical activity. Doesn't have a local Ob. Without contributing medical history.      ROS per HPI, negative if not otherwise mentioned.      History reviewed. No pertinent past medical history.  There are no active problems to display for this patient.   History reviewed. No pertinent surgical history.  OB History    Gravida  1   Para      Term      Preterm      AB      Living        SAB      TAB      Ectopic      Multiple      Live Births               Home Medications    Prior to Admission medications   Medication Sig Start Date End Date Taking? Authorizing Provider  acetaminophen (TYLENOL) 325 MG tablet Take 650 mg by mouth every 6 (six) hours as needed for mild pain.    [provider]  azithromycin (ZITHROMAX) 250 MG tablet Take 2 today then 1 daily 10/06/18   Freeman Caldron M, PA-C  ibuprofen (ADVIL,MOTRIN) 600 MG tablet Take 1 tablet (600 mg total) by mouth every 6 (six) hours as needed. Patient not taking: Reported on 10/06/2018 06/17/18   Melynda Ripple, MD  omeprazole (PRILOSEC) 40 MG capsule Take 1 capsule (40 mg total) by mouth daily. To decrease stomach acid Patient not taking: Reported on 10/06/2018 07/07/18   Fulp, Ander Gaster, MD  Prenatal Vit-Fe Fumarate-FA (PRENATAL COMPLETE) 14-0.4 MG TABS Take 1 tablet by mouth daily.  04/15/19   Zigmund Gottron, NP    Family History Family History  Problem Relation Age of Onset  . Healthy Mother   . Healthy Father     Social History Social History   Tobacco Use  . Smoking status: Never Smoker  . Smokeless tobacco: Never Used  Substance Use Topics  . Alcohol use: No  . Drug use: Never     Allergies   Patient has no known allergies.   Review of Systems Review of Systems   Physical Exam Triage Vital Signs ED Triage Vitals  Enc Vitals Group     BP 04/15/19 1006 120/80     Pulse Rate 04/15/19 1006 82     Resp 04/15/19 1006 16     Temp 04/15/19 1006 98.9 F (37.2 C)     Temp Source 04/15/19 1006 Oral     SpO2 04/15/19 1006 99 %     Weight --      Height --      Head Circumference --      Peak Flow --      Pain Score 04/15/19 1003 0     Pain Loc --      Pain Edu? --  Excl. in GC? --    No data found.  Updated Vital Signs BP 120/80 (BP Location: Left Arm)   Pulse 82   Temp 98.9 F (37.2 C) (Oral)   Resp 16   LMP 03/13/2019 (Exact Date)   SpO2 99%    Physical Exam Constitutional:      General: She is not in acute distress.    Appearance: She is well-developed.  Cardiovascular:     Rate and Rhythm: Normal rate.  Pulmonary:     Effort: Pulmonary effort is normal.  Abdominal:     Tenderness: There is no abdominal tenderness.  Genitourinary:    Comments: Denies vaginal bleeding  Skin:    General: Skin is warm and dry.  Neurological:     Mental Status: She is alert and oriented to person, place, and time.      UC Treatments / Results  Labs (all labs ordered are listed, but only abnormal results are displayed) Labs Reviewed  POCT PREGNANCY, URINE - Abnormal; Notable for the following components:      Result Value   Preg Test, Ur POSITIVE (*)    All other components within normal limits  POC URINE PREG, ED    EKG   Radiology No results found.  Procedures Procedures (including critical care time)   Medications Ordered in UC Medications - No data to display  Initial Impression / Assessment and Plan / UC Course  I have reviewed the triage vital signs and the nursing notes.  Pertinent labs & imaging results that were available during my care of the patient were reviewed by me and considered in my medical decision making (see chart for details).     Positive pregnancy by urine here today, with EDD of 12/18/2019 based on LMP. Encouraged establish with OB. Prenatal initiated. Patient verbalized understanding and agreeable to plan.   Final Clinical Impressions(s) / UC Diagnoses   Final diagnoses:  Positive pregnancy test     Discharge Instructions     Your urine pregnancy test is positive here today.  According to your last period you are approximately 4 weeks 5 days pregnant with an estimated due date of 12/18/2019.  Please start a daily prenatal vitamin.  Please call to set up your first OB appointment, which is typically done around 8-10 weeks.  If you develop any abdominal pain or bleeding please go to the [facility] hospital.    Tu prueba de embarazo en orina es positiva aqu hoy. De acuerdo con su ltimo perodo, tiene aproximadamente 4 semanas y 5 das de [place] con una fecha estimada de parto del 18/12/2019. Inicie una vitamina prenatal diaria. Llame para programar su primera cita con el obstetra, que generalmente se realiza entre 8 y 10 semanas. Si presenta dolor abdominal o sangrado, dirjase al hospital [facility].    ED Prescriptions    Medication Sig Dispense Auth. Provider   Prenatal Vit-Fe Fumarate-FA (PRENATAL COMPLETE) 14-0.4 MG TABS Take 1 tablet by mouth daily. 60 tablet Georgetta Haber, NP     PDMP not reviewed this encounter.   Georgetta Haber, NP 04/15/19 1233

## 2019-04-15 NOTE — ED Triage Notes (Signed)
Patient presents to Urgent Care with complaints of needing a blood test to see how far along in her pregnancy she is since having positive pregnancy tests at home. Patient reports her LMP was March 13 2019.

## 2019-04-15 NOTE — Discharge Instructions (Signed)
Your urine pregnancy test is positive here today.  According to your last period you are approximately 4 weeks 5 days pregnant with an estimated due date of 12/18/2019.  Please start a daily prenatal vitamin.  Please call to set up your first OB appointment, which is typically done around 8-10 weeks.  If you develop any abdominal pain or bleeding please go to the [facility] hospital.    Tu prueba de embarazo en orina es positiva aqu hoy. De acuerdo con su ltimo perodo, tiene aproximadamente 4 semanas y [place] con una fecha estimada de parto del 18/12/2019. Inicie una vitamina prenatal diaria. Llame para programar su primera cita con el obstetra, que generalmente se realiza entre 8 y 24 semanas. Si presenta dolor abdominal o sangrado, dirjase al hospital [facility].

## 2019-04-22 ENCOUNTER — Inpatient Hospital Stay (HOSPITAL_COMMUNITY): Payer: Medicaid Other

## 2019-04-22 ENCOUNTER — Inpatient Hospital Stay (HOSPITAL_COMMUNITY)
Admission: AD | Admit: 2019-04-22 | Discharge: 2019-04-22 | Disposition: A | Discharge status: Home / Self Care | Payer: Medicaid Other | Attending: Obstetrics & Gynecology | Admitting: Obstetrics & Gynecology

## 2019-04-22 ENCOUNTER — Other Ambulatory Visit: Payer: Self-pay

## 2019-04-22 ENCOUNTER — Encounter (HOSPITAL_COMMUNITY): Payer: Self-pay | Admitting: *Deleted

## 2019-04-22 DIAGNOSIS — Z3A01 Less than 8 weeks gestation of pregnancy: Secondary | ICD-10-CM | POA: Insufficient documentation

## 2019-04-22 DIAGNOSIS — R1030 Lower abdominal pain, unspecified: Secondary | ICD-10-CM | POA: Diagnosis not present

## 2019-04-22 DIAGNOSIS — O26891 Other specified pregnancy related conditions, first trimester: Secondary | ICD-10-CM | POA: Insufficient documentation

## 2019-04-22 DIAGNOSIS — O039 Complete or unspecified spontaneous abortion without complication: Secondary | ICD-10-CM

## 2019-04-22 DIAGNOSIS — O209 Hemorrhage in early pregnancy, unspecified: Secondary | ICD-10-CM | POA: Diagnosis not present

## 2019-04-22 DIAGNOSIS — Z79899 Other long term (current) drug therapy: Secondary | ICD-10-CM | POA: Diagnosis not present

## 2019-04-22 HISTORY — DX: Other specified health status: Z78.9

## 2019-04-22 LAB — URINALYSIS, ROUTINE W REFLEX MICROSCOPIC
Bilirubin Urine: NEGATIVE
Glucose, UA: NEGATIVE mg/dL
Ketones, ur: NEGATIVE mg/dL
Leukocytes,Ua: NEGATIVE
Nitrite: NEGATIVE
Protein, ur: NEGATIVE mg/dL
RBC / HPF: >50 RBC/hpf — ABNORMAL HIGH (ref 0–5)
Specific Gravity, Urine: 1.011 (ref 1.005–1.030)
pH: 7 (ref 5.0–8.0)

## 2019-04-22 LAB — HCG, QUANTITATIVE, PREGNANCY: hCG, Beta Chain, Quant, S: 4 m[IU]/mL (ref <5)

## 2019-04-22 LAB — CBC
HCT: 42.2 % (ref 36.0–46.0)
Hemoglobin: 14.1 g/dL (ref 12.0–15.0)
MCH: 30.9 pg (ref 26.0–34.0)
MCHC: 33.4 g/dL (ref 30.0–36.0)
MCV: 92.3 fL (ref 80.0–100.0)
Platelets: 321 10*3/uL (ref 150–400)
RBC: 4.57 MIL/uL (ref 3.87–5.11)
RDW: 12.7 % (ref 11.5–15.5)
WBC: 12.9 10*3/uL — ABNORMAL HIGH (ref 4.0–10.5)
nRBC: 0 % (ref 0.0–0.2)

## 2019-04-22 LAB — WET PREP, GENITAL
Clue Cells Wet Prep HPF POC: NONE SEEN
Sperm: NONE SEEN
Trich, Wet Prep: NONE SEEN
WBC, Wet Prep HPF POC: NONE SEEN
Yeast Wet Prep HPF POC: NONE SEEN

## 2019-04-22 MED ORDER — ACETAMINOPHEN 500 MG PO TABS
1000.0000 mg | ORAL_TABLET | Freq: Once | ORAL | Status: AC
  Administered 2019-04-22: 1000 mg via ORAL
  Filled 2019-04-22: qty 2

## 2019-04-22 NOTE — MAU Note (Signed)
Pt is G1P0 at [redacted]w[redacted]d came from [facility] ED for vaginal spotting since this morning. Also having intermittent lower abdominal pain. Denies N/V/D.

## 2019-04-22 NOTE — MAU Provider Note (Signed)
History     CSN: [id number]  Arrival date and time: 04/22/19 1516   First Provider Initiated Contact with Patient 04/22/19 1708      Chief Complaint  Patient presents with  . Vaginal Bleeding   HPI Ashley Payne is a 28 y.o. G2P0010 at [redacted]w[redacted]d by LMP who presents to MAU with chief complaint of vaginal bleeding in early pregnancy. This is a new problem, onset this morning. She also endorses lower abdominal cramping, new onset 11/13. She rates her pain as 7/10. She denies aggravating or alleviating factors. She has not taken medication or tried other treatments for this complaint. She is remote from intercourse.  OB History    Gravida  2   Para      Term      Preterm      AB  1   Living        SAB  1   TAB      Ectopic      Multiple      Live Births              Past Medical History:  Diagnosis Date  . Medical history non-contributory     Past Surgical History:  Procedure Laterality Date  . NO PAST SURGERIES      Family History  Problem Relation Age of Onset  . Healthy Mother   . Healthy Father     Social History   Tobacco Use  . Smoking status: Never Smoker  . Smokeless tobacco: Never Used  Substance Use Topics  . Alcohol use: No  . Drug use: Never    Allergies: No Known Allergies  Medications Prior to Admission  Medication Sig Dispense Refill Last Dose  . Prenatal Vit-Fe Fumarate-FA (PRENATAL COMPLETE) 14-0.4 MG TABS Take 1 tablet by mouth daily. 60 tablet 0 04/21/2019 at Unknown time  . acetaminophen (TYLENOL) 325 MG tablet Take 650 mg by mouth every 6 (six) hours as needed for mild pain.   Unknown at Unknown time  . azithromycin (ZITHROMAX) 250 MG tablet Take 2 today then 1 daily 6 tablet 0  at not taking  . ibuprofen (ADVIL,MOTRIN) 600 MG tablet Take 1 tablet (600 mg total) by mouth every 6 (six) hours as needed. (Patient not taking: Reported on 10/06/2018) 30 tablet 0   . omeprazole (PRILOSEC) 40 MG capsule Take 1 capsule (40 mg  total) by mouth daily. To decrease stomach acid (Patient not taking: Reported on 10/06/2018) 30 capsule 3     Review of Systems  Constitutional: Negative for chills, fatigue and fever.  Respiratory: Negative for shortness of breath.   Gastrointestinal: Positive for abdominal pain. Negative for nausea and vomiting.  Genitourinary: Positive for vaginal bleeding. Negative for difficulty urinating, dysuria and flank pain.  Musculoskeletal: Negative for back pain.  Neurological: Negative for syncope, weakness and headaches.  All other systems reviewed and are negative.  Physical Exam   Blood pressure 140/67, pulse (!) 102, height 5\' 3"  (1.6 m), weight 74.5 kg, last menstrual period 03/13/2019, SpO2 100 %.  Physical Exam  Nursing note and vitals reviewed. Constitutional: She is oriented to person, place, and time. She appears well-developed and well-nourished.  Cardiovascular: Normal rate.  Respiratory: Effort normal and breath sounds normal.  GI: Soft. Bowel sounds are normal. She exhibits no distension. There is no abdominal tenderness. There is no rebound and no guarding.  Genitourinary:    Genitourinary Comments: Moderate amount of dark red blood visualized in vaginal vault. Removed  with fox swab x 2. No overt active bleeding following use of swab   Neurological: She is alert and oriented to person, place, and time.  Skin: Skin is warm and dry.  Psychiatric: She has a normal mood and affect. Her behavior is normal. Judgment and thought content normal.   MAU Course/MDM  Procedures   --SAB in 2018. Patient desires pregnancy. Discussed miscarriage follow-up appointment as well as pre-conception counseling in clinic to possibly include genetic screening as appropriate. --Pain resolved with PO Tylenol  Patient Vitals for the past 24 hrs:  BP Pulse SpO2 Height Weight  04/22/19 1841 123/72 (!) 110 - - -  04/22/19 1647 140/67 - - 5\' 3"  (1.6 m) 74.5 kg  04/22/19 1646 - (!) 102 100 % - -    Results for orders placed or performed during the hospital encounter of 04/22/19 (from the past 24 hour(s))  CBC     Status: Abnormal   Collection Time: 04/22/19  5:09 PM  Result Value Ref Range   WBC 12.9 (H) 4.0 - 10.5 K/uL   RBC 4.57 3.87 - 5.11 MIL/uL   Hemoglobin 14.1 12.0 - 15.0 g/dL   HCT 42.2 36.0 - 46.0 %   MCV 92.3 80.0 - 100.0 fL   MCH 30.9 26.0 - 34.0 pg   MCHC 33.4 30.0 - 36.0 g/dL   RDW 12.7 11.5 - 15.5 %   Platelets 321 150 - 400 K/uL   nRBC 0.0 0.0 - 0.2 %  hCG, quantitative, pregnancy     Status: None   Collection Time: 04/22/19  5:09 PM  Result Value Ref Range   hCG, Beta Chain, Quant, S 4 <5 mIU/mL  ABO/Rh     Status: None   Collection Time: 04/22/19  5:09 PM  Result Value Ref Range   ABO/RH(D)      O POS Performed at [facility], [place] [address] [place], [place] [address]   Wet prep, genital     Status: None   Collection Time: 04/22/19  5:10 PM   Specimen: PATH Cytology Cervicovaginal Ancillary Only  Result Value Ref Range   Yeast Wet Prep HPF POC NONE SEEN NONE SEEN   Trich, Wet Prep NONE SEEN NONE SEEN   Clue Cells Wet Prep HPF POC NONE SEEN NONE SEEN   WBC, Wet Prep HPF POC NONE SEEN NONE SEEN   Sperm NONE SEEN    US Ob Less Than 14 Weeks With Ob Transvaginal  Result Date: 04/22/2019 CLINICAL DATA:  Bleeding EXAM: OBSTETRIC <14 WK ULTRASOUND TECHNIQUE: Transabdominal ultrasound was performed for evaluation of the gestation as well as the maternal uterus and adnexal regions. COMPARISON:  None FINDINGS: LMP: 03/13/2019 GA by LMP: 5 w  5 d Intrauterine gestational sac: None Yolk sac:  Not Visualized. Embryo:  Not Visualized. Cardiac Activity: Not Visualized. Subchorionic hemorrhage:  None visualized. Maternal uterus/adnexae: Right ovary measures 4.0 x 2.2 x 1.6 cm. Centrally hypoattenuating peripherally hypervascular corpus luteum noted in the right ovary. The left ovary measures 2.7 x 1.6 x 3.0 cm. No concerning adnexal lesions. No visible adnexal  fluid collections. IMPRESSION: No intrauterine pregnancy visualized. Differential considerations would include early intrauterine pregnancy too early to visualize, spontaneous abortion, or occult ectopic pregnancy. Recommend close clinical followup and serial quantitative beta HCGs and ultrasounds. Electronically Signed   By: Lovena Le M.D.   On: 04/22/2019 18:27   Assessment and Plan  --28 y.o. G2P0010 at [redacted]w[redacted]d by LMP --Quant hCG 4 suggesting miscarriage --Blood  type O POS --Denies pain prior to discharge --Discharge home in stable condition with bleeding precautions  F/U: --[facility] in one week, message sent to clinic  Language barrier: --iPad Spanish language interpreter utilized for initial assessment and plan of care  --Jerilynn Birkenheadhelsea Fair, DO at bedside providing interpretation for discussion of results and next steps  Calvert CantorSamantha C , CNM 04/22/2019, 6:47 PM

## 2019-04-22 NOTE — ED Notes (Signed)
Ashley Payne in MAU made aware of patient, advised they will be expecting her.  Transport contacted to take patient over to MAU.

## 2019-04-22 NOTE — Discharge Instructions (Signed)
Aborto espontáneo °Miscarriage °El aborto espontáneo es la pérdida de un bebé que no ha nacido (feto) antes de la semana 20 del embarazo. °Siga estas indicaciones en su casa: °Medicamentos ° °· Tome los medicamentos de venta libre y los recetados solamente como se lo haya indicado el médico. °· Si le recetaron un antibiótico, tómelo como se lo haya indicado el médico. No deje de tomar los antibióticos aunque comience a sentirse mejor. °· No tome antiinflamatorios no esteroideos (AINE), a menos que el médico le diga que son seguros para usted. Estos incluyen aspirina e ibuprofeno. Estos medicamentos pueden provocarle sangrado. °Actividad °· Haga reposo según lo indicado. Pregúntele al médico qué actividades son seguras para usted. °· Pida ayuda para realizar las tareas de la casa durante este tiempo. °Instrucciones generales °· Anote cuántos apósitos usa por día y cuán saturados están. °· Observe la cantidad de tejido o grumos de sangre (coágulos de sangre) que expulsa por la vagina. Guarde las cantidades grandes de tejido para llevárselas al médico. °· No use tampones, no se haga duchas vaginales ni tenga relaciones sexuales hasta que el médico la autorice. °· Para que usted y su pareja puedan sobrellevar el proceso de duelo, hable con su médico o busque apoyo psicológico. °· Cuando esté lista, acuda al médico para hablar sobre los pasos que debe seguir para cuidar su salud. Además, hable con su médico sobre las medidas que debe adoptar para tener un embarazo saludable en el futuro. °· Concurra a todas las visitas de seguimiento como se lo haya indicado el médico. Esto es importante. °Comuníquese con un médico si: °· Tiene fiebre o siente escalofríos. °· Tiene una secreción vaginal con mal olor. °· Aumenta el sangrado. °Solicite ayuda de inmediato si: °· Tiene espasmos o dolor muy intensos en el abdomen o en la espalda. °· Elimina grumos de sangre por la vagina, que tienen el tamaño de una nuez o más. °· Elimina  tejido por la vagina, que tiene el tamaño de una nuez o más. °· Empapa más de un apósito de tamaño normal por hora. °· Se siente débil o mareada. °· Pierde el conocimiento (se desmaya). °· Siente tristeza que no se va o piensa en lastimarse. °Resumen °· El aborto espontáneo es la pérdida de un bebé que no ha nacido antes de la semana 20 del embarazo. °· Siga las indicaciones de su médico para el cuidado en su hogar. Concurra a todas las visitas de control. °· Para que usted y su pareja puedan sobrellevar el proceso de duelo, hable con su médico o busque apoyo psicológico. °Esta información no tiene como fin reemplazar el consejo del médico. Asegúrese de hacerle al médico cualquier pregunta que tenga. °Document Released: 11/18/2011 Document Revised: 02/23/2017 Document Reviewed: 02/23/2017 °Elsevier Patient Education © 2020 Elsevier Inc. ° °

## 2019-04-22 NOTE — ED Notes (Signed)
Transport arrived to ED lobby to take patient over to [facility]

## 2019-04-23 LAB — ABO/RH: ABO/RH(D): O POS

## 2019-04-25 LAB — GC/CHLAMYDIA PROBE AMP (~~LOC~~) NOT AT ARMC
Chlamydia: NEGATIVE
Comment: NEGATIVE
Comment: NORMAL
Neisseria Gonorrhea: NEGATIVE

## 2019-05-06 ENCOUNTER — Other Ambulatory Visit: Payer: Self-pay

## 2019-05-06 ENCOUNTER — Ambulatory Visit (HOSPITAL_COMMUNITY)
Admission: EM | Admit: 2019-05-06 | Discharge: 2019-05-06 | Disposition: A | Discharge status: Home / Self Care | Payer: Medicaid Other | Attending: Urgent Care | Admitting: Urgent Care

## 2019-05-06 ENCOUNTER — Encounter (HOSPITAL_COMMUNITY): Payer: Self-pay

## 2019-05-06 DIAGNOSIS — R300 Dysuria: Secondary | ICD-10-CM

## 2019-05-06 DIAGNOSIS — R1013 Epigastric pain: Secondary | ICD-10-CM | POA: Diagnosis present

## 2019-05-06 DIAGNOSIS — K0889 Other specified disorders of teeth and supporting structures: Secondary | ICD-10-CM

## 2019-05-06 DIAGNOSIS — K029 Dental caries, unspecified: Secondary | ICD-10-CM

## 2019-05-06 DIAGNOSIS — K219 Gastro-esophageal reflux disease without esophagitis: Secondary | ICD-10-CM | POA: Diagnosis not present

## 2019-05-06 DIAGNOSIS — Z3202 Encounter for pregnancy test, result negative: Secondary | ICD-10-CM

## 2019-05-06 LAB — POCT URINALYSIS DIP (DEVICE)
Bilirubin Urine: NEGATIVE
Glucose, UA: NEGATIVE mg/dL
Hgb urine dipstick: NEGATIVE
Ketones, ur: NEGATIVE mg/dL
Leukocytes,Ua: NEGATIVE
Nitrite: NEGATIVE
Protein, ur: NEGATIVE mg/dL
Specific Gravity, Urine: 1.01 (ref 1.005–1.030)
Urobilinogen, UA: 0.2 mg/dL (ref 0.0–1.0)
pH: 6 (ref 5.0–8.0)

## 2019-05-06 LAB — POC URINE PREG, ED: Preg Test, Ur: NEGATIVE

## 2019-05-06 LAB — POCT PREGNANCY, URINE: Preg Test, Ur: NEGATIVE

## 2019-05-06 MED ORDER — LIDOCAINE VISCOUS HCL 2 % MT SOLN: 15.0000 mL | Freq: Four times a day (QID) | OROMUCOSAL | 0 refills | Status: DC | PRN

## 2019-05-06 MED ORDER — FAMOTIDINE 20 MG PO TABS: 20.0000 mg | ORAL_TABLET | Freq: Two times a day (BID) | ORAL | 0 refills | Status: DC

## 2019-05-06 NOTE — ED Triage Notes (Addendum)
Pt states having dental pain x 4 days. Pt reports she started having mild abdominal pain, after she drank coffee today, pt think is related to the acid reflux. Pt states having a mild burning sensation when urinating.

## 2019-05-06 NOTE — Discharge Instructions (Addendum)
[facility] [phone] extension [phone]  Dr. Donn Pierini [phone] [place] [phone] [address] [facility] [place].  Rescue mission [phone] extension [phone], [place], [place], [address] First come first serve for the first 10 clients.  May do simple extractions only, no wisdom teeth or surgery.  You may try the second for Thursday of the month starting at [place] [facility] You may call the school to see if they are still helping to provide dental care for emergent cases.   [employer]

## 2019-05-06 NOTE — ED Provider Notes (Signed)
[place]   MRN: [id number] DOB: Sep 12, 1990  Subjective:   Ashley Payne is a 28 y.o. female presenting for 4-day history of mild intermittent posterior lower dental pain, mild occasional dysuria and recurrence of her gastritis/GERD.  Patient has not tried medications for relief.  Previously has tried Zantac or omeprazole for GERD.  Admits that she does not eat GERD friendly foods.  Patient tries to hydrate well.  She does not have a dentist.  No current facility-administered medications for this encounter.   Current Outpatient Medications:  .  acetaminophen (TYLENOL) 500 MG tablet, Take 500 mg by mouth every 6 (six) hours as needed., Disp: , Rfl:    No Known Allergies  Past Medical History:  Diagnosis Date  . Medical history non-contributory      Past Surgical History:  Procedure Laterality Date  . NO PAST SURGERIES      Family History  Problem Relation Age of Onset  . Healthy Mother   . Healthy Father     Social History   Tobacco Use  . Smoking status: Never Smoker  . Smokeless tobacco: Never Used  Substance Use Topics  . Alcohol use: No  . Drug use: Never    Review of Systems  Constitutional: Negative for fever and malaise/fatigue.  HENT: Negative for congestion, ear pain, sinus pain and sore throat.   Eyes: Negative for blurred vision, discharge and redness.  Respiratory: Negative for cough, hemoptysis, shortness of breath and wheezing.   Cardiovascular: Negative for chest pain.  Gastrointestinal: Positive for abdominal pain (epigastric) and heartburn. Negative for diarrhea, nausea and vomiting.  Genitourinary: Positive for dysuria. Negative for flank pain, frequency, hematuria and urgency.  Musculoskeletal: Negative for myalgias.  Skin: Negative for rash.  Neurological: Negative for dizziness, weakness and headaches.  Psychiatric/Behavioral: Negative for depression and substance abuse.     Objective:   Vitals: BP 108/74 (BP  Location: Left Arm)   Pulse 90   Temp 97.8 F (36.6 C) (Oral)   Resp 16   LMP 03/13/2019 (Exact Date)   SpO2 100%   Physical Exam Constitutional:      General: She is not in acute distress.    Appearance: Normal appearance. She is well-developed and normal weight. She is not ill-appearing, toxic-appearing or diaphoretic.  HENT:     Head: Normocephalic and atraumatic.     Right Ear: External ear normal.     Left Ear: External ear normal.     Nose: Nose normal.     Mouth/Throat:     Mouth: Mucous membranes are moist.     Pharynx: Oropharynx is clear.   Eyes:     General: No scleral icterus.    Extraocular Movements: Extraocular movements intact.     Pupils: Pupils are equal, round, and reactive to light.  Cardiovascular:     Rate and Rhythm: Normal rate and regular rhythm.     Heart sounds: Normal heart sounds. No murmur. No friction rub. No gallop.   Pulmonary:     Effort: Pulmonary effort is normal. No respiratory distress.     Breath sounds: Normal breath sounds. No stridor. No wheezing, rhonchi or rales.  Abdominal:     General: Bowel sounds are normal. There is no distension.     Palpations: Abdomen is soft. There is no mass.     Tenderness: There is no abdominal tenderness. There is no right CVA tenderness, left CVA tenderness, guarding or rebound.  Skin:    General: Skin is warm  and dry.     Coloration: Skin is not pale.     Findings: No rash.  Neurological:     General: No focal deficit present.     Mental Status: She is alert and oriented to person, place, and time.  Psychiatric:        Mood and Affect: Mood normal.        Behavior: Behavior normal.        Thought Content: Thought content normal.        Judgment: Judgment normal.     Results for orders placed or performed during the hospital encounter of 05/06/19 (from the past 24 hour(s))  POCT urinalysis dip (device)     Status: None   Collection Time: 05/06/19  5:48 PM  Result Value Ref Range   Glucose,  UA NEGATIVE NEGATIVE mg/dL   Bilirubin Urine NEGATIVE NEGATIVE   Ketones, ur NEGATIVE NEGATIVE mg/dL   Specific Gravity, Urine 1.010 1.005 - 1.030   Hgb urine dipstick NEGATIVE NEGATIVE   pH 6.0 5.0 - 8.0   Protein, ur NEGATIVE NEGATIVE mg/dL   Urobilinogen, UA 0.2 0.0 - 1.0 mg/dL   Nitrite NEGATIVE NEGATIVE   Leukocytes,Ua NEGATIVE NEGATIVE  POC urine pregnancy     Status: None   Collection Time: 05/06/19  5:55 PM  Result Value Ref Range   Preg Test, Ur NEGATIVE NEGATIVE  Pregnancy, urine POC     Status: None   Collection Time: 05/06/19  5:55 PM  Result Value Ref Range   Preg Test, Ur NEGATIVE NEGATIVE    Assessment and Plan :   1. Gastroesophageal reflux disease without esophagitis   2. Abdominal pain, epigastric   3. Dysuria   4. Pain, dental   5. Pain due to dental caries     We will have patient use Pepcid, counseled on GERD friendly foods. Patient tested negative for pregnancy today. Urine culture pending, recommended patient hydrate with plain water with at least 2 L/day.  We will have patient use supportive care for her teeth including viscous lidocaine, [employer] toothpaste.  Patient does not have overt signs of dental infection and therefore will hold off on using antibiotics.  She is to contact dental practice, information provided to the patient. Counseled patient on potential for adverse effects with medications prescribed/recommended today, ER and return-to-clinic precautions discussed, patient verbalized understanding.    Wallis Bamberg, PA-C 05/06/19 1815

## 2019-05-08 LAB — URINE CULTURE: Culture: <10000 — AB

## 2019-05-12 ENCOUNTER — Encounter: Payer: Self-pay | Admitting: Obstetrics and Gynecology

## 2019-05-12 ENCOUNTER — Ambulatory Visit (INDEPENDENT_AMBULATORY_CARE_PROVIDER_SITE_OTHER): Payer: Self-pay | Admitting: Obstetrics and Gynecology

## 2019-05-12 ENCOUNTER — Other Ambulatory Visit: Payer: Self-pay

## 2019-05-12 VITALS — BP 131/82 | HR 109 | Wt 163.5 lb

## 2019-05-12 DIAGNOSIS — N96 Recurrent pregnancy loss: Secondary | ICD-10-CM

## 2019-05-12 MED ORDER — NORGESTIMATE-ETH ESTRADIOL 0.25-35 MG-MCG PO TABS: 1.0000 | ORAL_TABLET | Freq: Every day | ORAL | 11 refills | Status: DC

## 2019-05-12 NOTE — Progress Notes (Deleted)
GYNECOLOGY ANNUAL PREVENTATIVE CARE ENCOUNTER NOTE  History:     Ashley Payne is a 28 y.o. G26P0030 female here for a routine annual gynecologic exam.  Current complaints: ***.   Denies abnormal vaginal bleeding, discharge, pelvic pain, problems with intercourse or other gynecologic concerns.    Gynecologic History No LMP recorded. Contraception: {method:5051} Last Pap: ***. Results were: {norm/abn:16337} with negative HPV Last mammogram: ***. Results were: {norm/abn:16337}  Obstetric History OB History  Gravida Para Term Preterm AB Living  3 0 0   3    SAB TAB Ectopic Multiple Live Births  3            # Outcome Date GA Lbr Len/2nd Weight Sex Delivery Anes PTL Lv  3 SAB 04/22/19 [redacted]w[redacted]d         2 SAB 03/27/19 109w2d         1 SAB 2018 [redacted]w[redacted]d           Past Medical History:  Diagnosis Date  . Medical history non-contributory     Past Surgical History:  Procedure Laterality Date  . NO PAST SURGERIES      Current Outpatient Medications on File Prior to Visit  Medication Sig Dispense Refill  . acetaminophen (TYLENOL) 500 MG tablet Take 500 mg by mouth every 6 (six) hours as needed.    . famotidine (PEPCID) 20 MG tablet Take 1 tablet (20 mg total) by mouth 2 (two) times daily. (Patient not taking: Reported on 05/12/2019) 60 tablet 0  . lidocaine (XYLOCAINE) 2 % solution Use as directed 15 mLs in the mouth or throat every 6 (six) hours as needed for mouth pain. (Patient not taking: Reported on 05/12/2019) 200 mL 0   No current facility-administered medications on file prior to visit.    No Known Allergies  Social History:  reports that she has never smoked. She has never used smokeless tobacco. She reports that she does not drink alcohol or use drugs.  Family History  Problem Relation Age of Onset  . Healthy Mother   . Healthy Father     The following portions of the patient's history were reviewed and updated as appropriate: allergies, current medications,  past family history, past medical history, past social history, past surgical history and problem list.  Review of Systems Pertinent items noted in HPI and remainder of comprehensive ROS otherwise negative.  Physical Exam:  BP 131/82   Pulse (!) 109   Wt 163 lb 8 oz (74.2 kg)   BMI 28.96 kg/m  CONSTITUTIONAL: Well-developed, well-nourished female in no acute distress.  HENT:  Normocephalic, atraumatic, External right and left ear normal. Oropharynx is clear and moist EYES: Conjunctivae and EOM are normal. Pupils are equal, round, and reactive to light. No scleral icterus.  NECK: Normal range of motion, supple, no masses.  Normal thyroid.  SKIN: Skin is warm and dry. No rash noted. Not diaphoretic. No erythema. No pallor. MUSCULOSKELETAL: Normal range of motion. No tenderness.  No cyanosis, clubbing, or edema.  2+ distal pulses. NEUROLOGIC: Alert and oriented to person, place, and time. Normal reflexes, muscle tone coordination.  PSYCHIATRIC: Normal mood and affect. Normal behavior. Normal judgment and thought content. CARDIOVASCULAR: Normal heart rate noted, regular rhythm RESPIRATORY: Clear to auscultation bilaterally. Effort and breath sounds normal, no problems with respiration noted. BREASTS: Symmetric in size. No masses, tenderness, skin changes, nipple drainage, or lymphadenopathy bilaterally. ABDOMEN: Soft, no distention noted.  No tenderness, rebound or guarding.  PELVIC: Normal appearing external  genitalia and urethral meatus; normal appearing vaginal mucosa and cervix.  No abnormal discharge noted.  Pap smear obtained.  Normal uterine size, no other palpable masses, no uterine or adnexal tenderness.   Assessment and Plan:        Will follow up results of pap smear and manage accordingly. Mammogram scheduled Routine preventative health maintenance measures emphasized. Please refer to After Visit Summary for other counseling recommendations.   Product/process development scientist for  [employer], [place]

## 2019-05-12 NOTE — Progress Notes (Signed)
No bleeding or cramping Feels okay. Wants  To understand why she keeps miscarrying.  Dx with POCS in [place].

## 2019-05-12 NOTE — Progress Notes (Signed)
    GYNECOLOGY ANNUAL PREVENTATIVE CARE ENCOUNTER NOTE  History:     Ashley Payne is a 28 y.o. G44P0020 female here for a f/u from ED d/t recent miscarriage. She is self pay. This is miscarriage # 2. She wants to know why she cannot keep a pregnancy healthy. She no longer has pain or bleeding.   She was diagnosed with PCOS in [place]. She has not seen a provider for this.  She is afraid to become pregnant d/t losses and would like to start [facility] pills.   Quant in the ED was 4.   Obstetric History OB History  Gravida Para Term Preterm AB Living  2 0 0   2    SAB TAB Ectopic Multiple Live Births  2            # Outcome Date GA Lbr Len/2nd Weight Sex Delivery Anes PTL Lv  2 SAB 04/22/19 [redacted]w[redacted]d         1 SAB 2018 [redacted]w[redacted]d           Past Medical History:  Diagnosis Date  . Medical history non-contributory     Past Surgical History:  Procedure Laterality Date  . NO PAST SURGERIES      Current Outpatient Medications on File Prior to Visit  Medication Sig Dispense Refill  . acetaminophen (TYLENOL) 500 MG tablet Take 500 mg by mouth every 6 (six) hours as needed.    . famotidine (PEPCID) 20 MG tablet Take 1 tablet (20 mg total) by mouth 2 (two) times daily. (Patient not taking: Reported on 05/12/2019) 60 tablet 0  . lidocaine (XYLOCAINE) 2 % solution Use as directed 15 mLs in the mouth or throat every 6 (six) hours as needed for mouth pain. (Patient not taking: Reported on 05/12/2019) 200 mL 0   No current facility-administered medications on file prior to visit.    No Known Allergies  Social History:  reports that she has never smoked. She has never used smokeless tobacco. She reports that she does not drink alcohol or use drugs.  Family History  Problem Relation Age of Onset  . Healthy Mother   . Healthy Father     The following portions of the patient's history were reviewed and updated as appropriate: allergies, current medications, past family history, past  medical history, past social history, past surgical history and problem list.  Review of Systems Pertinent items noted in HPI and remainder of comprehensive ROS otherwise negative.  Physical Exam:  BP 131/82   Pulse (!) 109   Wt 163 lb 8 oz (74.2 kg)   BMI 28.96 kg/m  CONSTITUTIONAL: Well-developed, well-nourished female in no acute distress.  HENT:  Normocephalic. EYES: Conjunctivae and EOM are normal. Pupils are equal, round, and reactive to light. No scleral icterus.  NEUROLOGIC: Alert and oriented to person, place, and time. PSYCHIATRIC: Normal mood and affect. Normal behavior. Normal judgment and thought content.  Assessment and Plan:   1. History of recurrent miscarriages  Miscarriage X 2 Doing well, concerned about having addition miscarriages. Would like to start [facility] pills until she gets a full workup for PCOS.  Rx: Sprintec. No history of PE or DVT. She will have private health insurance starting in January and plans to make an appt here with MD to discuss recurring miscarriage.  Support given.   Graham Doukas, Artist Pais, [place] for [employer], [place]

## 2019-05-14 DIAGNOSIS — N96 Recurrent pregnancy loss: Secondary | ICD-10-CM | POA: Insufficient documentation

## 2019-06-09 ENCOUNTER — Encounter (HOSPITAL_COMMUNITY): Payer: Self-pay | Admitting: Emergency Medicine

## 2019-06-09 ENCOUNTER — Ambulatory Visit (HOSPITAL_COMMUNITY)
Admission: EM | Admit: 2019-06-09 | Discharge: 2019-06-09 | Disposition: A | Discharge status: Home / Self Care | Payer: Medicaid Other | Attending: Family Medicine | Admitting: Family Medicine

## 2019-06-09 ENCOUNTER — Other Ambulatory Visit: Payer: Self-pay

## 2019-06-09 DIAGNOSIS — R5381 Other malaise: Secondary | ICD-10-CM

## 2019-06-09 DIAGNOSIS — Z20822 Contact with and (suspected) exposure to covid-19: Secondary | ICD-10-CM | POA: Diagnosis present

## 2019-06-09 DIAGNOSIS — Z1152 Encounter for screening for COVID-19: Secondary | ICD-10-CM

## 2019-06-09 DIAGNOSIS — J069 Acute upper respiratory infection, unspecified: Secondary | ICD-10-CM

## 2019-06-09 DIAGNOSIS — R05 Cough: Secondary | ICD-10-CM

## 2019-06-09 MED ORDER — PROMETHAZINE-DM 6.25-15 MG/5ML PO SYRP: 5.0000 mL | ORAL_SOLUTION | Freq: Every evening | ORAL | 0 refills | Status: DC | PRN

## 2019-06-09 MED ORDER — BENZONATATE 100 MG PO CAPS: 100.0000 mg | ORAL_CAPSULE | Freq: Three times a day (TID) | ORAL | 0 refills | Status: DC | PRN

## 2019-06-09 NOTE — ED Provider Notes (Signed)
[facility]   MRN: [id number] DOB: 1991/02/19  Subjective:   Ashley Payne is a 29 y.o. female presenting for 1 week history of persistent mild to moderate malaise including productive cough, sinus congestion that is worse at night.  Patient has been using Robitussin with intermittent relief.  She initially started out with bilateral ear fullness but this cleared up.  Denies any known COVID-19 contacts.  Has not been to work in some time.  Used to work at [employer].  States that she had the flu in the past and feels similar but not as bad.  Denies taking chronic medications.  No Known Allergies  Past Medical History:  Diagnosis Date  . Medical history non-contributory      Past Surgical History:  Procedure Laterality Date  . NO PAST SURGERIES      Family History  Problem Relation Age of Onset  . Healthy Mother   . Healthy Father     Social History   Tobacco Use  . Smoking status: Never Smoker  . Smokeless tobacco: Never Used  Substance Use Topics  . Alcohol use: No  . Drug use: Never    Review of Systems  Constitutional: Positive for malaise/fatigue. Negative for fever.  HENT: Positive for congestion. Negative for ear pain, sinus pain and sore throat.   Eyes: Negative for discharge and redness.  Respiratory: Positive for cough. Negative for hemoptysis, shortness of breath and wheezing.   Cardiovascular: Negative for chest pain.  Gastrointestinal: Negative for abdominal pain, diarrhea, nausea and vomiting.  Genitourinary: Negative for dysuria, flank pain and hematuria.  Musculoskeletal: Negative for myalgias.  Skin: Negative for rash.  Neurological: Negative for dizziness, weakness and headaches.  Psychiatric/Behavioral: Negative for depression and substance abuse.     Objective:   Vitals: BP 119/75 (BP Location: Left Arm)   Pulse 79   Temp 100.1 F (37.8 C) (Oral)   Resp 16   LMP 06/24/2018   SpO2 100%   Physical Exam Constitutional:        General: She is not in acute distress.    Appearance: Normal appearance. She is well-developed. She is not ill-appearing, toxic-appearing or diaphoretic.  HENT:     Head: Normocephalic and atraumatic.     Nose: Nose normal.     Mouth/Throat:     Mouth: Mucous membranes are moist.  Eyes:     Extraocular Movements: Extraocular movements intact.     Pupils: Pupils are equal, round, and reactive to light.  Cardiovascular:     Rate and Rhythm: Normal rate and regular rhythm.     Pulses: Normal pulses.     Heart sounds: Normal heart sounds. No murmur. No friction rub. No gallop.   Pulmonary:     Effort: Pulmonary effort is normal. No respiratory distress.     Breath sounds: Normal breath sounds. No stridor. No wheezing, rhonchi or rales.  Skin:    General: Skin is warm and dry.     Findings: No rash.  Neurological:     Mental Status: She is alert and oriented to person, place, and time.  Psychiatric:        Mood and Affect: Mood normal.        Behavior: Behavior normal.        Thought Content: Thought content normal.        Judgment: Judgment normal.      Assessment and Plan :   1. Viral URI with cough   2. Malaise  Will manage for viral illness such as viral URI, viral rhinitis, possible COVID-19. Counseled patient on nature of COVID-19 including modes of transmission, diagnostic testing, management and supportive care.  Offered symptomatic relief. COVID 19 testing is pending. Counseled patient on potential for adverse effects with medications prescribed/recommended today, ER and return-to-clinic precautions discussed, patient verbalized understanding.     Jaynee Eagles, PA-C 06/09/19 1721

## 2019-06-09 NOTE — ED Triage Notes (Signed)
1 week history of cold symptoms, productive cough.  Denies fever at home.

## 2019-06-09 NOTE — Discharge Instructions (Addendum)
Para el dolor de garganta o tos puede usar un t de miel. Use 3 cucharaditas de miel con jugo exprimido de [employer]. Coloque trozos de Microbiologist en 1/2-1 taza de agua y caliente sobre la estufa. Luego mezcle los ingredientes y repita cada 4 horas. Tome Tylenol 500 mg cada 6 horas. Hidrata muy bien con al menos 2 litros de agua al dia. Coma comidas ligeras como sopas para [employer] electrolitos y coma frutas, verduras. Comience un antihistamnico como Zyrtec (cetirizina) 10mg  al dia. Puede usar pseudoefedrina (Sudafed) de venta libre para el goteo posnasal, congestin a una dosis de 60 mg cada 8 horas o 120 mg cada 12 horas. Use el jarabe por la noche para su tos y las capsulas durante el dia.

## 2019-06-11 LAB — NOVEL CORONAVIRUS, NAA (HOSP ORDER, SEND-OUT TO REF LAB; TAT 18-24 HRS): SARS-CoV-2, NAA: NOT DETECTED

## 2019-06-20 ENCOUNTER — Encounter: Payer: Self-pay | Admitting: Obstetrics and Gynecology

## 2019-06-20 ENCOUNTER — Ambulatory Visit (INDEPENDENT_AMBULATORY_CARE_PROVIDER_SITE_OTHER): Payer: Medicaid Other | Admitting: Obstetrics and Gynecology

## 2019-06-20 ENCOUNTER — Other Ambulatory Visit: Payer: Self-pay

## 2019-06-20 DIAGNOSIS — Z789 Other specified health status: Secondary | ICD-10-CM | POA: Diagnosis not present

## 2019-06-20 DIAGNOSIS — N96 Recurrent pregnancy loss: Secondary | ICD-10-CM

## 2019-06-20 DIAGNOSIS — Z603 Acculturation difficulty: Secondary | ICD-10-CM

## 2019-06-20 NOTE — Progress Notes (Signed)
Called home and mobile number at 1354; busy signal received from both numbers.  I connected with  Ashley Payne on 06/20/19 at 1403 with interpreter Raquel by telephone and verified that I am speaking with the correct person using two identifiers.   I discussed the limitations, risks, security and privacy concerns of performing an evaluation and management service by telephone and the availability of in person appointments. I also discussed with the patient that there may be a patient responsible charge related to this service. The patient expressed understanding and agreed to proceed.  Marjo Bicker, RN 06/20/2019  2:03 PM

## 2019-06-20 NOTE — Progress Notes (Signed)
Obstetrics and Gynecology Established Patient Evaluation   TELEHEALTH VIRTUAL GYNECOLOGY VISIT ENCOUNTER NOTE  I connected with Ashley Payne on 06/21/19 at  1:55 PM EST by telephone at home and verified that I am speaking with the correct person using two identifiers.   I discussed the limitations, risks, security and privacy concerns of performing an evaluation and management service by telephone and the availability of in person appointments. I also discussed with the patient that there may be a patient responsible charge related to this service. The patient expressed understanding and agreed to proceed.   Appointment Date: 06/20/2019  OBGYN Clinic: [facility]  Primary Care Provider: Antony Blackbird  Referring Provider: Altamease Oiler, NP  Chief Complaint: h/o recurrent miscarriages  History of Present Illness: Ashley Payne is a 29 y.o. Hispanic G2P0020 (Patient's last menstrual period was 05/25/2019 (exact date).), seen for the above chief complaint. Her past medical history is significant for  G1 2018 6wks SAB G2 04/2019 6wks SAB Same FOB for both pregnancies. He has two children (6 and 54 y/o) from prior relationship, he has diabetes. Neither of them smoke or drink   She has a period qmonth, regular, lasts for approximately 6 days. Qmonth, regular, 6 days  Review of Systems: Pertinent items are noted in HPI.   Patient Active Problem List   Diagnosis Date Noted  . History of recurrent miscarriages 05/14/2019    Past Medical History:  Past Medical History:  Diagnosis Date  . Medical history non-contributory     Past Surgical History:  Past Surgical History:  Procedure Laterality Date  . NO PAST SURGERIES      Past Obstetrical History:  OB History  Gravida Para Term Preterm AB Living  2 0 0   2    SAB TAB Ectopic Multiple Live Births  2            # Outcome Date GA Lbr Len/2nd Weight Sex Delivery Anes PTL Lv  2 SAB  04/22/19 [redacted]w[redacted]d         1 SAB 2018 [redacted]w[redacted]d           Past Gynecological History: As per HPI. History of Pap Smear(s): unknown History of STI(s): No. She is currently using no method for contraception.   Social History:  Social History   Socioeconomic History  . Marital status: Married    Spouse name: Not on file  . Number of children: Not on file  . Years of education: Not on file  . Highest education level: Not on file  Occupational History  . Not on file  Tobacco Use  . Smoking status: Never Smoker  . Smokeless tobacco: Never Used  Substance and Sexual Activity  . Alcohol use: No  . Drug use: Never  . Sexual activity: Yes  Other Topics Concern  . Not on file  Social History Narrative  . Not on file   Social Determinants of Health   Financial Resource Strain:   . Difficulty of Paying Living Expenses: Not on file  Food Insecurity:   . Worried About Charity fundraiser in the Last Year: Not on file  . Ran Out of Food in the Last Year: Not on file  Transportation Needs:   . Lack of Transportation (Medical): Not on file  . Lack of Transportation (Non-Medical): Not on file  Physical Activity:   . Days of Exercise per Week: Not on file  . Minutes of Exercise per Session: Not on file  Stress:   . Feeling of Stress : Not on file  Social Connections:   . Frequency of Communication with Friends and Family: Not on file  . Frequency of Social Gatherings with Friends and Family: Not on file  . Attends Religious Services: Not on file  . Active Member of Clubs or Organizations: Not on file  . Attends Banker Meetings: Not on file  . Marital Status: Not on file  Intimate Partner Violence:   . Fear of Current or Ex-Partner: Not on file  . Emotionally Abused: Not on file  . Physically Abused: Not on file  . Sexually Abused: Not on file    Family History:  Family History  Problem Relation Age of Onset  . Healthy Mother   . Healthy Father      Medications Ashley Payne had no medications administered during this visit. Current Outpatient Medications  Medication Sig Dispense Refill  . acetaminophen (TYLENOL) 500 MG tablet Take 500 mg by mouth every 6 (six) hours as needed.    . benzonatate (TESSALON) 100 MG capsule Take 1-2 capsules (100-200 mg total) by mouth 3 (three) times daily as needed. 60 capsule 0  . promethazine-dextromethorphan (PROMETHAZINE-DM) 6.25-15 MG/5ML syrup Take 5 mLs by mouth at bedtime as needed for cough. 100 mL 0   No current facility-administered medications for this visit.    Allergies Patient has no known allergies.   Physical Exam:  LMP 05/25/2019 (Exact Date)  There is no height or weight on file to calculate BMI. General appearance: Well nourished, well developed female in no acute distress.   Laboratory: None  Radiology: None  Assessment: pt stable  Plan:  1. History of recurrent miscarriages Labs placed and will see about doing an SIS. I also d/w her re: future potential to do dna analysis on her and her partner - Hemoglobin A1c; Future - HIV antibody (with reflex); Future - RPR; Future - TSH; Future - Lupus anticoagulant; Future - Cardiolipin antibodies, IgM+IgG; Future  Interpreter used  RTC after results and u/s  I discussed the assessment and treatment plan with the patient. The patient was provided an opportunity to ask questions and all were answered. The patient agreed with the plan and demonstrated an understanding of the instructions.   The patient was advised to call back or seek an in-person evaluation/go to the ED if the symptoms worsen or if the condition fails to improve as anticipated.  I provided 30 minutes of non-face-to-face time during this encounter. The visit was done via a MyChart visit.   Cornelia Copa MD Attending [facility])

## 2019-06-21 DIAGNOSIS — Z789 Other specified health status: Secondary | ICD-10-CM | POA: Insufficient documentation

## 2019-06-23 ENCOUNTER — Other Ambulatory Visit: Payer: Medicaid Other

## 2019-06-23 ENCOUNTER — Other Ambulatory Visit: Payer: Self-pay

## 2019-06-23 DIAGNOSIS — N96 Recurrent pregnancy loss: Secondary | ICD-10-CM

## 2019-06-25 LAB — LUPUS ANTICOAGULANT
Dilute Viper Venom Time: 31.4 s (ref 0.0–47.0)
PTT Lupus Anticoagulant: 32.8 s (ref 0.0–51.9)
Thrombin Time: 16.4 s (ref 0.0–23.0)
dPT Confirm Ratio: 0.97 Ratio (ref 0.00–1.40)
dPT: 32.6 s (ref 0.0–55.0)

## 2019-06-25 LAB — RPR: RPR Ser Ql: NONREACTIVE

## 2019-06-25 LAB — HEMOGLOBIN A1C
Est. average glucose Bld gHb Est-mCnc: 94 mg/dL
Hgb A1c MFr Bld: 4.9 % (ref 4.8–5.6)

## 2019-06-25 LAB — CARDIOLIPIN ANTIBODIES, IGM+IGG
Anticardiolipin IgG: <9 GPL U/mL (ref 0–14)
Anticardiolipin IgM: 10 MPL U/mL (ref 0–12)

## 2019-06-25 LAB — TSH: TSH: 1.42 u[IU]/mL (ref 0.450–4.500)

## 2019-06-25 LAB — HIV ANTIBODY (ROUTINE TESTING W REFLEX): HIV Screen 4th Generation wRfx: NONREACTIVE

## 2019-06-28 ENCOUNTER — Telehealth (INDEPENDENT_AMBULATORY_CARE_PROVIDER_SITE_OTHER): Payer: Medicaid Other | Admitting: Lactation Services

## 2019-06-28 ENCOUNTER — Other Ambulatory Visit: Payer: Self-pay | Admitting: Lactation Services

## 2019-06-28 ENCOUNTER — Telehealth: Payer: Self-pay | Admitting: Lactation Services

## 2019-06-28 ENCOUNTER — Other Ambulatory Visit: Payer: Self-pay | Admitting: Obstetrics and Gynecology

## 2019-06-28 DIAGNOSIS — N96 Recurrent pregnancy loss: Secondary | ICD-10-CM

## 2019-06-28 NOTE — Progress Notes (Signed)
[place] order changed to include Pelvic US per Radiology.   Called Radiology and spoke with Darl Pikes and scheduled pt [place] on 2/2 at 1 pm. Pt is to arrive at [facility] by 12:45 with a full bladder.   Called pt with assistance of [employer], Research officer, trade union.   Pt called on mobile phone and she did not answer, voicemail was not set up so no message was left.   Called the home phone # and female answered. He asked that we leave her a message on her phone. Informed him that was unable to leave message as voicemail not set up. Gave him the message to have pt call the office. He voiced understanding.

## 2019-06-28 NOTE — Telephone Encounter (Signed)
-----   Message from  Bing, MD sent at 06/28/2019  7:23 AM EST ----- Can you let her know that everything looks normal and that we to set her up for a transvaginal pelvic ultrasound? Can you schedule the pelvic ultrasound in about a week? If not, let me know. Thanks (order is in)

## 2019-06-28 NOTE — Telephone Encounter (Signed)
Pt called the office back. Spoke with pt with assistance of [employer], Research officer, trade union. Informed pt that her lab results were normal and that she has an [place] scheduled for 2/2 at 1 pm at [address]. Pt voiced understanding and has no questions or concerns at this time.

## 2019-06-28 NOTE — Telephone Encounter (Signed)
Called Radiology and spoke with Darl Pikes and scheduled pt [place] on 2/2 at 1 pm. Pt is to arrive at [facility] by 12:45 with a full bladder.   Called pt with assistance of [employer], Research officer, trade union.   Pt called on mobile phone and she did not answer, voicemail was not set up so no message was left.   Called the home phone # and female answered. He asked that we leave her a message on her phone. Informed him that was unable to leave message as voicemail not set up. Gave him the message to have pt call the office. He voiced understanding.

## 2019-07-05 ENCOUNTER — Other Ambulatory Visit: Payer: Self-pay

## 2019-07-05 ENCOUNTER — Ambulatory Visit (HOSPITAL_COMMUNITY)
Admission: RE | Admit: 2019-07-05 | Discharge: 2019-07-05 | Disposition: A | Discharge status: Home / Self Care | Payer: Medicaid Other | Source: Ambulatory Visit | Attending: Obstetrics and Gynecology | Admitting: Obstetrics and Gynecology

## 2019-07-05 DIAGNOSIS — N96 Recurrent pregnancy loss: Secondary | ICD-10-CM | POA: Diagnosis not present

## 2019-07-13 ENCOUNTER — Telehealth: Payer: Self-pay | Admitting: Obstetrics and Gynecology

## 2019-07-13 NOTE — Telephone Encounter (Signed)
GYN Telephone Note I tried [phone] and [phone] but both numbers didn't work, in order to go over results. Will have front desk try and if it doesn't work, to send a letter.  Cornelia Copa MD Attending [facility] ([facility]) 07/13/2019 Time: [phone]

## 2019-07-15 ENCOUNTER — Encounter: Payer: Self-pay | Admitting: Family Medicine

## 2019-07-15 ENCOUNTER — Telehealth: Payer: Self-pay | Admitting: Family Medicine

## 2019-07-15 NOTE — Telephone Encounter (Signed)
Called the patient per the message from Dr. Vergie Living. The contact numbers in epic are non-working numbers. Mailing the patient a letter informing to call our office to schedule an appointment.

## 2019-07-26 ENCOUNTER — Encounter (HOSPITAL_COMMUNITY): Payer: Self-pay

## 2019-07-26 ENCOUNTER — Ambulatory Visit (HOSPITAL_COMMUNITY)
Admission: EM | Admit: 2019-07-26 | Discharge: 2019-07-26 | Disposition: A | Discharge status: Home / Self Care | Payer: Medicaid Other | Attending: Urgent Care | Admitting: Urgent Care

## 2019-07-26 ENCOUNTER — Other Ambulatory Visit: Payer: Self-pay

## 2019-07-26 DIAGNOSIS — Z20822 Contact with and (suspected) exposure to covid-19: Secondary | ICD-10-CM | POA: Diagnosis not present

## 2019-07-26 NOTE — ED Triage Notes (Signed)
Pt presents to [facility] for COVID testing for international travel. Pt denies any signs and symptoms.

## 2019-07-26 NOTE — Discharge Instructions (Signed)
We will notify you of your COVID-19 test results as they arrive and may take between 2 to 7 days.  In the meantime, if you develop symptoms including fever, chest pain, shortness of breath despite our current treatment plan then please report to the emergency room as this may be a sign of worsening status from possible COVID-19 infection.   For sore throat or cough try using a honey-based tea. Use 3 teaspoons of honey with juice squeezed from half lemon. Place shaved pieces of ginger into 1/2-1 cup of water and warm over stove top. Then mix the ingredients and repeat every 4 hours as needed. Please take Tylenol 325mg every 6 hours. Hydrate very well with at least 2 liters of water. Eat light meals such as soups to replenish electrolytes and soft fruits, veggies. Start an antihistamine like Zyrtec (cetirizine) at 10mg daily for postnasal drainage, sinus congestion.  

## 2019-07-26 NOTE — ED Provider Notes (Signed)
  [facility]   MRN: [id number] DOB: 1991/04/01  Subjective:   Ashley Payne is a 29 y.o. female presenting for COVID 19 testing for international travel. Has had general COVID exposure through the public.   Denies taking any chronic medications.   No Known Allergies  Past Medical History:  Diagnosis Date  . Medical history non-contributory      Past Surgical History:  Procedure Laterality Date  . NO PAST SURGERIES      Family History  Problem Relation Age of Onset  . Healthy Mother   . Healthy Father     Social History   Tobacco Use  . Smoking status: Never Smoker  . Smokeless tobacco: Never Used  Substance Use Topics  . Alcohol use: No  . Drug use: Never    Review of Systems  Constitutional: Negative for fever and malaise/fatigue.  HENT: Negative for congestion, ear pain, sinus pain and sore throat.   Eyes: Negative for blurred vision, double vision, discharge and redness.  Respiratory: Negative for cough, hemoptysis, shortness of breath and wheezing.   Cardiovascular: Negative for chest pain.  Gastrointestinal: Negative for abdominal pain, diarrhea, nausea and vomiting.  Genitourinary: Negative for dysuria, flank pain and hematuria.  Musculoskeletal: Negative for myalgias.  Skin: Negative for rash.  Neurological: Negative for dizziness, weakness and headaches.  Psychiatric/Behavioral: Negative for depression and substance abuse.     Objective:   Vitals: BP 127/74 (BP Location: Right Arm)   Pulse 91   Temp 99 F (37.2 C) (Oral)   Resp 16   SpO2 99%   Physical Exam Constitutional:      General: She is not in acute distress.    Appearance: Normal appearance. She is well-developed. She is not ill-appearing.  HENT:     Head: Normocephalic and atraumatic.     Nose: Nose normal.     Mouth/Throat:     Mouth: Mucous membranes are moist.     Pharynx: Oropharynx is clear.  Eyes:     General: No scleral icterus.    Extraocular  Movements: Extraocular movements intact.     Pupils: Pupils are equal, round, and reactive to light.  Cardiovascular:     Rate and Rhythm: Normal rate.  Pulmonary:     Effort: Pulmonary effort is normal.  Skin:    General: Skin is warm and dry.  Neurological:     General: No focal deficit present.     Mental Status: She is alert and oriented to person, place, and time.  Psychiatric:        Mood and Affect: Mood normal.        Behavior: Behavior normal.     Assessment and Plan :   1. Exposure to COVID-19 virus     COVID 19 testing pending. Will notify through mychart.    Wallis Bamberg, [place] 07/26/19 [id number]

## 2019-07-28 LAB — NOVEL CORONAVIRUS, NAA (HOSP ORDER, SEND-OUT TO REF LAB; TAT 18-24 HRS): SARS-CoV-2, NAA: NOT DETECTED

## 2019-11-15 ENCOUNTER — Telehealth: Payer: Self-pay

## 2019-11-15 NOTE — Telephone Encounter (Addendum)
Pt called and message was not understood.

## 2019-11-16 NOTE — Telephone Encounter (Signed)
Attempted to contact pt x 2 with Eda R., and was unable to leave a message due phone kept leaving a busy signal.    Addison Naegeli, RN

## 2020-10-03 ENCOUNTER — Ambulatory Visit: Payer: Medicaid Other | Admitting: Physician Assistant

## 2020-11-06 IMAGING — US US OB < 14 WEEKS - US OB TV
1 series · 15 of 28 positions shown · non-contrast
Comparison: None

CLINICAL DATA: Bleeding

EXAM:
OBSTETRIC <14 WK ULTRASOUND
TECHNIQUE: Transabdominal ultrasound was performed for evaluation of the
gestation as well as the maternal uterus and adnexal regions.

[Series 1: us ob < 14 weeks - us ob tv · 15 of 56 slices shown]
[im 1/56]
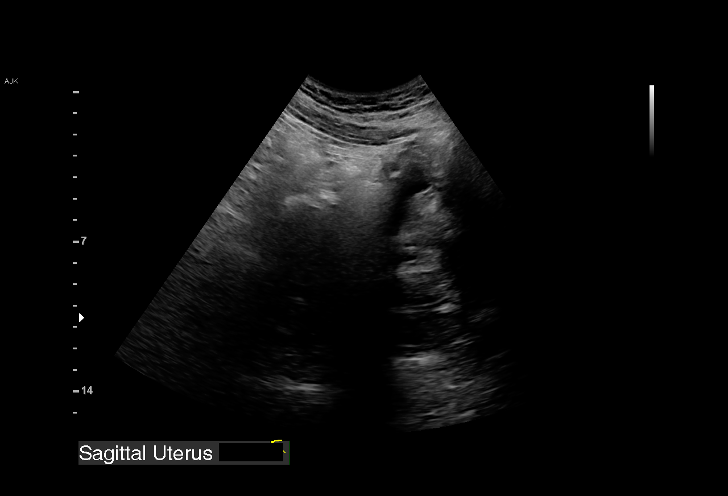
[im 5/56]
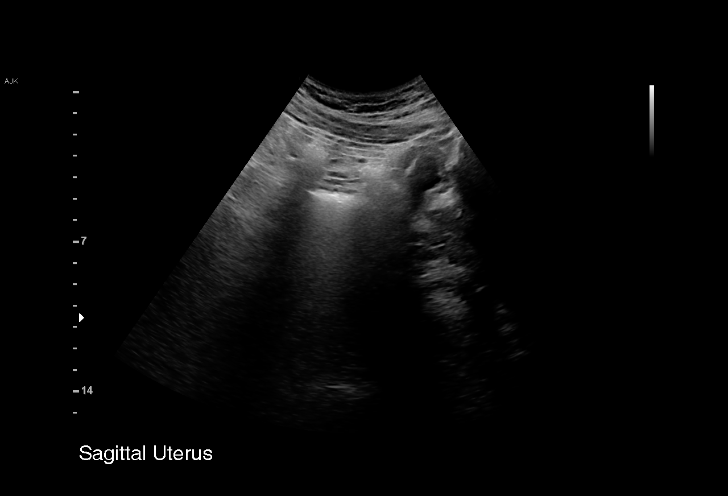
[im 9/56]
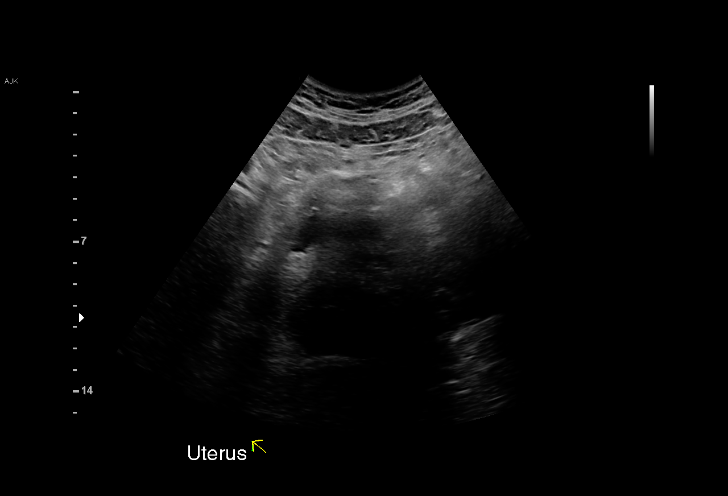
[im 13/56]
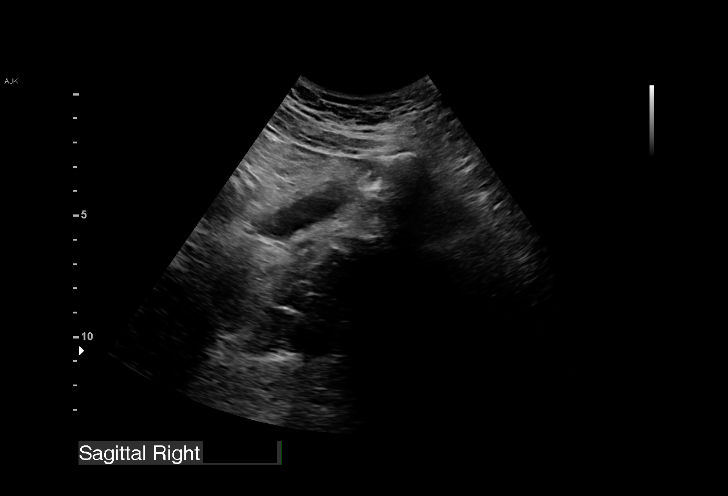
[im 17/56]
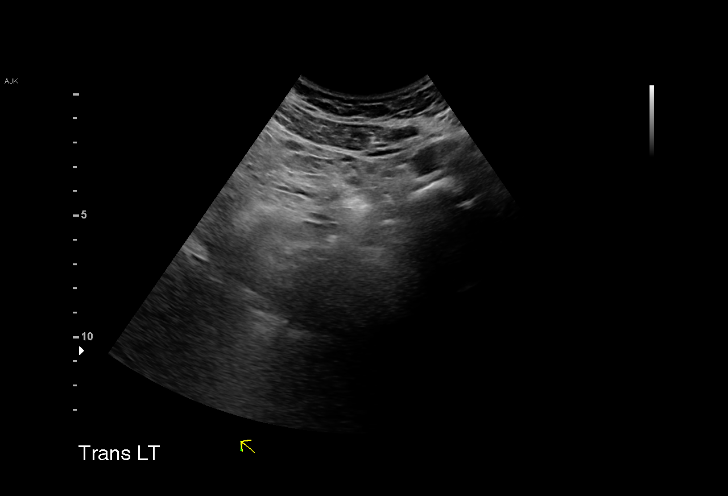
[im 21/56]
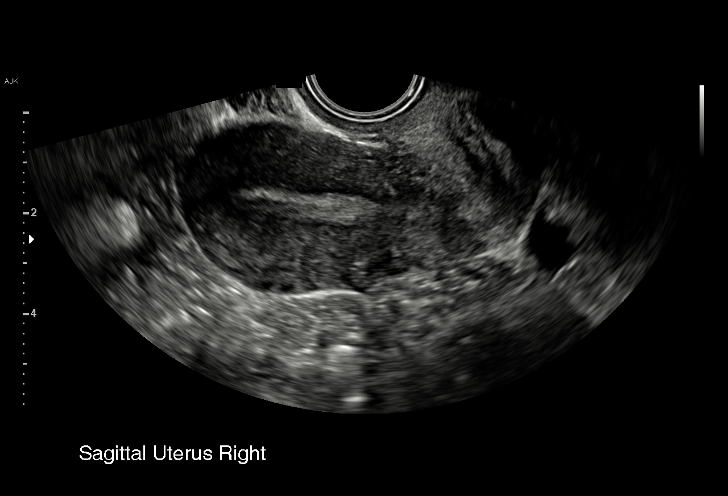
[im 25/56]
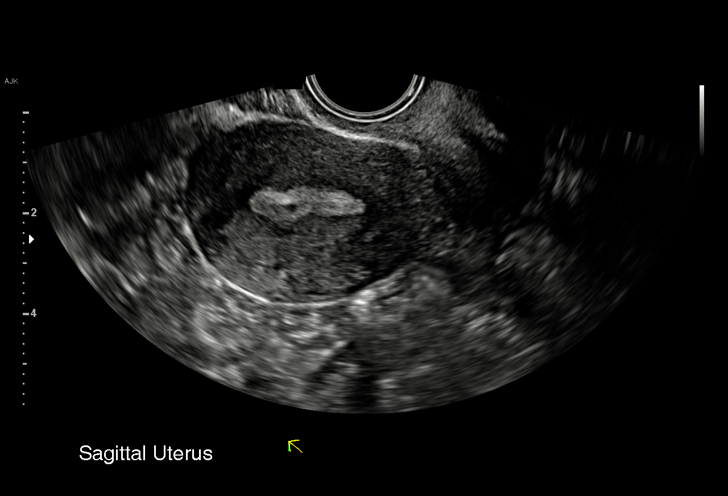
[im 29/56]
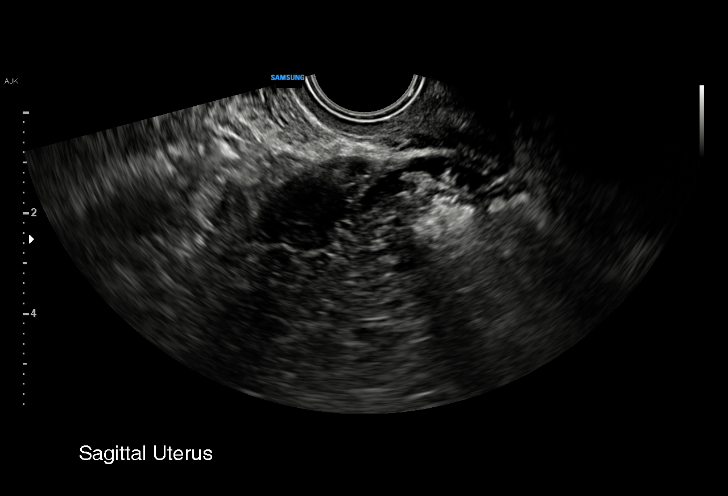
[im 31/56]
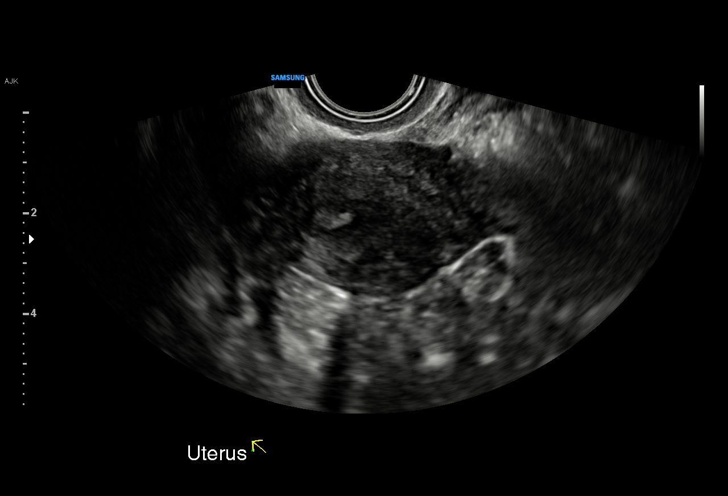
[im 35/56]
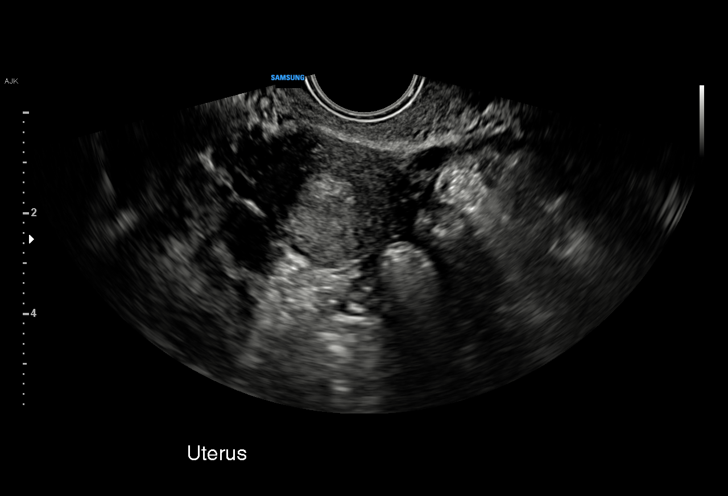
[im 39/56]
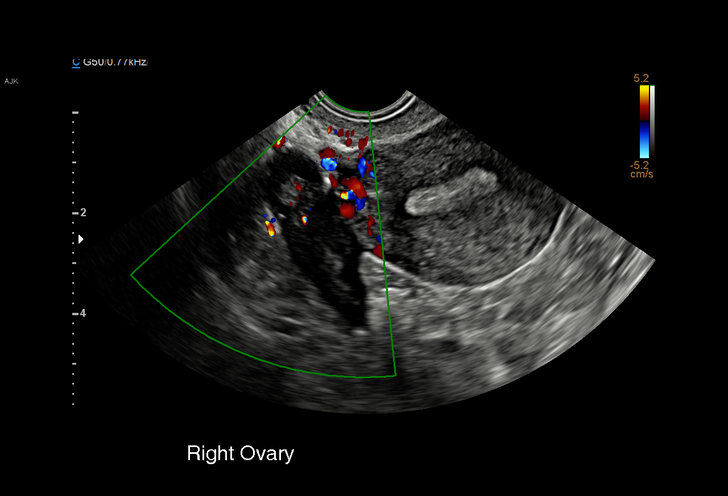
[im 43/56]
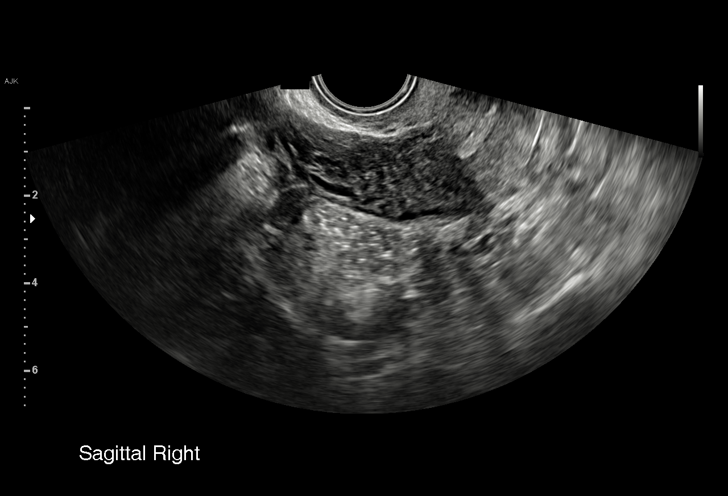
[im 47/56]
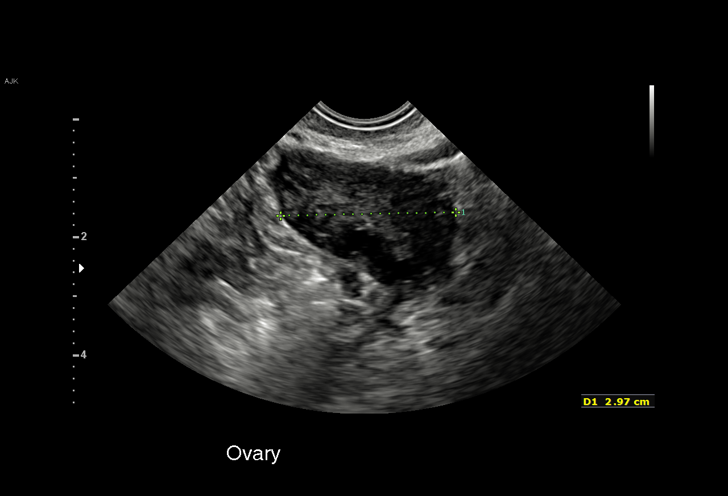
[im 51/56]
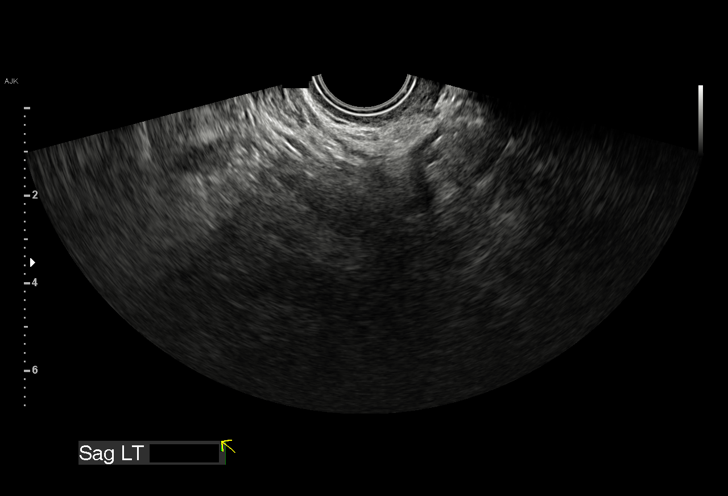
[im 56/56]
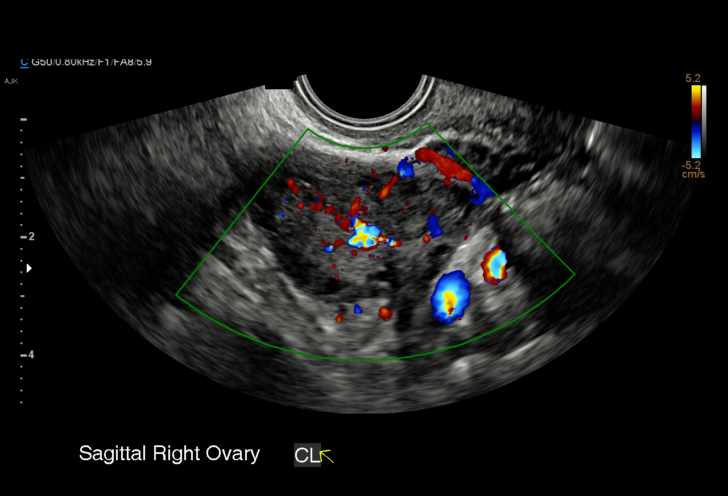

[15 of 28 positions shown; findings below may reference images not displayed]

FINDINGS: LMP: 03/13/2019

GA by LMP: 5 w  5 d

Intrauterine gestational sac: None

Yolk sac:  Not Visualized.

Embryo:  Not Visualized.

Cardiac Activity: Not Visualized.

Subchorionic hemorrhage:  None visualized.

Maternal uterus/adnexae: Right ovary measures 4.0 x 2.2 x 1.6 cm.
Centrally hypoattenuating peripherally hypervascular corpus luteum
noted in the right ovary. The left ovary measures 2.7 x 1.6 x
cm. No concerning adnexal lesions. No visible adnexal fluid
collections.
IMPRESSION: No intrauterine pregnancy visualized. Differential considerations
would include early intrauterine pregnancy too early to visualize,
spontaneous abortion, or occult ectopic pregnancy. Recommend close
clinical followup and serial quantitative beta HCGs and ultrasounds.

## 2020-11-08 ENCOUNTER — Ambulatory Visit: Payer: Medicaid Other | Attending: Physician Assistant | Admitting: Physician Assistant

## 2020-11-08 ENCOUNTER — Other Ambulatory Visit: Payer: Self-pay

## 2020-11-08 ENCOUNTER — Encounter: Payer: Self-pay | Admitting: Physician Assistant

## 2020-11-08 VITALS — BP 115/79 | HR 81 | Temp 98.5°F | Resp 15 | Ht 62.8 in | Wt 165.4 lb

## 2020-11-08 DIAGNOSIS — N96 Recurrent pregnancy loss: Secondary | ICD-10-CM | POA: Diagnosis not present

## 2020-11-08 DIAGNOSIS — E559 Vitamin D deficiency, unspecified: Secondary | ICD-10-CM

## 2020-11-08 DIAGNOSIS — Z131 Encounter for screening for diabetes mellitus: Secondary | ICD-10-CM

## 2020-11-08 DIAGNOSIS — Z79899 Other long term (current) drug therapy: Secondary | ICD-10-CM | POA: Diagnosis not present

## 2020-11-08 DIAGNOSIS — Z789 Other specified health status: Secondary | ICD-10-CM | POA: Diagnosis not present

## 2020-11-08 NOTE — Progress Notes (Signed)
Sandee Adria Devon, is a 30 y.o. female  YTK:[id number]  FTD:[id number]  DOB - Aug 14, 1990  Subjective:  Chief Complaint and HPI: Shirel Mallis is a 30 y.o. female here today for bloodwork and check up and wants to be assigned a new PCP.  She gave birth to her baby 03/30/2020 [place]  She would like a gyn referral so if she decides to have more babies.  No issues or concerns currently.     ROS:   Constitutional:  No f/c, No night sweats, No unexplained weight loss. EENT:  No vision changes, No blurry vision, No hearing changes. No mouth, throat, or ear problems.  Respiratory: No cough, No SOB Cardiac: No CP, no palpitations GI:  No abd pain, No N/V/D. GU: No Urinary s/sx Musculoskeletal: No joint pain Neuro: No headache, no dizziness, no motor weakness.  Skin: No rash Endocrine:  No polydipsia. No polyuria.  Psych: Denies SI/HI  No problems updated.  ALLERGIES: No Known Allergies  PAST MEDICAL HISTORY: Past Medical History:  Diagnosis Date   Medical history non-contributory     MEDICATIONS AT HOME: Prior to Admission medications   Medication Sig Start Date End Date Taking? Authorizing Provider  acetaminophen (TYLENOL) 500 MG tablet Take 500 mg by mouth every 6 (six) hours as needed.   Yes [provider]  famotidine (PEPCID) 20 MG tablet Take 1 tablet (20 mg total) by mouth 2 (two) times daily. Patient not taking: Reported on 05/12/2019 05/06/19 06/09/19  Wallis Bamberg, PA-C  norgestimate-ethinyl estradiol (ORTHO-CYCLEN) 0.25-35 MG-MCG tablet Take 1 tablet by mouth daily. 05/12/19 06/09/19  Rasch, Victorino Dike I, NP     Objective:  EXAM:   Vitals:   11/08/20 1622  BP: 115/79  Pulse: 81  Resp: 15  Temp: 98.5 F (36.9 C)  SpO2: 100%  Weight: 165 lb 6.4 oz (75 kg)  Height: 5' 2.8" (1.595 m)    General appearance : A&OX3. NAD. Non-toxic-appearing HEENT: Atraumatic and Normocephalic.  PERRLA. EOM intact.   Chest/Lungs:   Breathing-non-labored, Good air entry bilaterally, breath sounds normal without rales, rhonchi, or wheezing  CVS: S1 S2 regular, no murmurs, gallops, rubs  Extremities: Bilateral Lower Ext shows no edema, both legs are warm to touch with = pulse throughout Neurology:  CN II-XII grossly intact, Non focal.   Psych:  TP linear. J/I WNL. Normal speech. Appropriate eye contact and affect.  Skin:  No Rash  Data Review Lab Results  Component Value Date   HGBA1C 4.9 06/23/2019     Assessment & Plan   1. Language barrier [employer]) interpreters used and additional time performing visit was required.   2. History of recurrent miscarriages For future pregnancies - Ambulatory referral to Gynecology - CBC with Differential/Platelet - Comprehensive metabolic panel  3. Screening for diabetes mellitus I have had a lengthy discussion and provided education about insulin resistance and the intake of too much sugar/refined carbohydrates.  I have advised the patient to work at a goal of eliminating sugary drinks, candy, desserts, sweets, refined sugars, processed foods, and white carbohydrates.  The patient expresses understanding.  - Hemoglobin A1c  4. Vitamin D deficiency disease - Vitamin D, 25-hydroxy     Patient have been counseled extensively about nutrition and exercise  Return in about 6 months (around 05/10/2021) for assign new PCP.  The patient was given clear instructions to go to ER or return to medical center if symptoms don't improve, worsen or new problems develop. The patient verbalized understanding.  The patient was told to call to get lab results if they haven't heard anything in the next week.     Georgian Co, PA-C [facility] [place], [place] [phone]   11/08/2020, 4:51 PM Patient ID: Jimeka Balan, female   DOB: [birth year], 30 y.o.   MRN: [id number]

## 2020-11-08 NOTE — Progress Notes (Signed)
Follow up-former Fulp,MD pt  Pt desires blood work  And needs referral to OB/GYN

## 2020-11-09 LAB — COMPREHENSIVE METABOLIC PANEL
ALT: 11 IU/L (ref 0–32)
AST: 13 IU/L (ref 0–40)
Albumin/Globulin Ratio: 1.4 (ref 1.2–2.2)
Albumin: 4.6 g/dL (ref 3.9–5.0)
Alkaline Phosphatase: 87 IU/L (ref 44–121)
BUN/Creatinine Ratio: 12 (ref 9–23)
BUN: 8 mg/dL (ref 6–20)
Bilirubin Total: 0.5 mg/dL (ref 0.0–1.2)
CO2: 24 mmol/L (ref 20–29)
Calcium: 10 mg/dL (ref 8.7–10.2)
Chloride: 101 mmol/L (ref 96–106)
Creatinine, Ser: 0.66 mg/dL (ref 0.57–1.00)
Globulin, Total: 3.3 g/dL (ref 1.5–4.5)
Glucose: 91 mg/dL (ref 65–99)
Potassium: 4.2 mmol/L (ref 3.5–5.2)
Sodium: 138 mmol/L (ref 134–144)
Total Protein: 7.9 g/dL (ref 6.0–8.5)
eGFR: 121 mL/min/{1.73_m2} (ref >59)

## 2020-11-09 LAB — CBC WITH DIFFERENTIAL/PLATELET
Basophils Absolute: 0.1 10*3/uL (ref 0.0–0.2)
Basos: 1 %
EOS (ABSOLUTE): 0.1 10*3/uL (ref 0.0–0.4)
Eos: 1 %
Hematocrit: 39.4 % (ref 34.0–46.6)
Hemoglobin: 12.8 g/dL (ref 11.1–15.9)
Immature Grans (Abs): 0 10*3/uL (ref 0.0–0.1)
Immature Granulocytes: 0 %
Lymphocytes Absolute: 2.1 10*3/uL (ref 0.7–3.1)
Lymphs: 25 %
MCH: 28.1 pg (ref 26.6–33.0)
MCHC: 32.5 g/dL (ref 31.5–35.7)
MCV: 86 fL (ref 79–97)
Monocytes Absolute: 0.6 10*3/uL (ref 0.1–0.9)
Monocytes: 7 %
Neutrophils Absolute: 5.3 10*3/uL (ref 1.4–7.0)
Neutrophils: 66 %
Platelets: 310 10*3/uL (ref 150–450)
RBC: 4.56 x10E6/uL (ref 3.77–5.28)
RDW: 13.3 % (ref 11.7–15.4)
WBC: 8.3 10*3/uL (ref 3.4–10.8)

## 2020-11-09 LAB — VITAMIN D 25 HYDROXY (VIT D DEFICIENCY, FRACTURES): Vit D, 25-Hydroxy: 27 ng/mL — ABNORMAL LOW (ref 30.0–100.0)

## 2020-11-09 LAB — HEMOGLOBIN A1C
Est. average glucose Bld gHb Est-mCnc: 103 mg/dL
Hgb A1c MFr Bld: 5.2 % (ref 4.8–5.6)

## 2020-11-10 ENCOUNTER — Encounter: Payer: Self-pay | Admitting: Physician Assistant

## 2021-01-19 IMAGING — US US PELVIS COMPLETE WITH TRANSVAGINAL
1 series · 15 of 25 positions shown · non-contrast
Comparison: None

CLINICAL DATA: Recurrent miscarriages

EXAM:
TRANSABDOMINAL AND TRANSVAGINAL ULTRASOUND OF PELVIS
TECHNIQUE: Both transabdominal and transvaginal ultrasound examinations of the
pelvis were performed. Transabdominal technique was performed for
global imaging of the pelvis including uterus, ovaries, adnexal
regions, and pelvic cul-de-sac. It was necessary to proceed with
endovaginal exam following the transabdominal exam to visualize the
uterus and endometrium.

[Series 1: us pelvis complete with transvaginal · 15 of 83 slices shown]
[im 1/83]
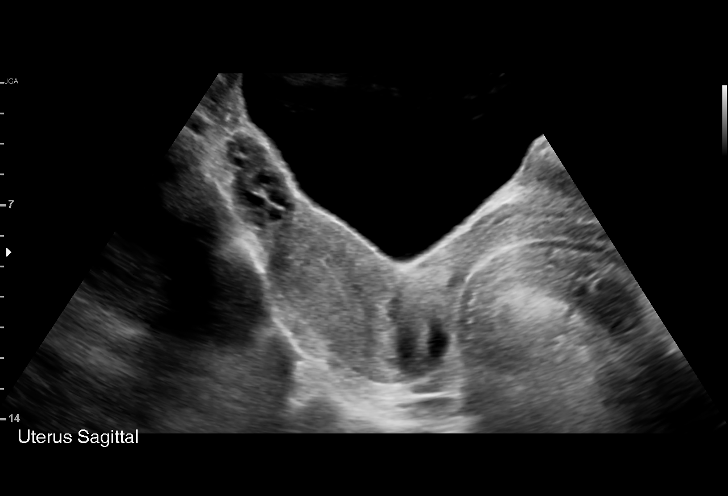
[im 7/83]
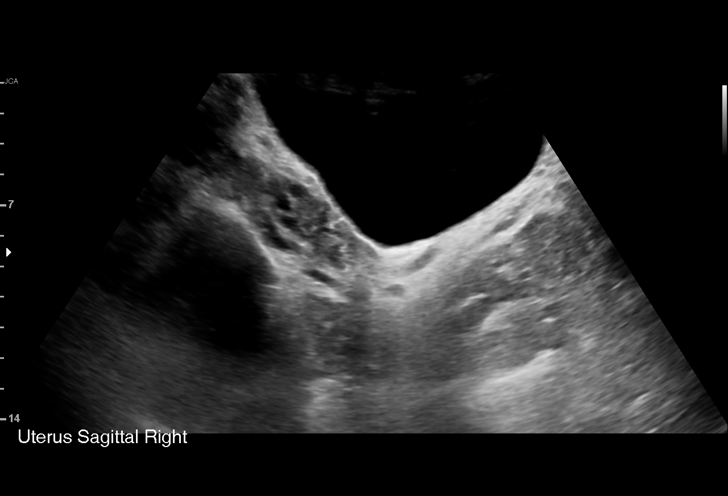
[im 14/83]
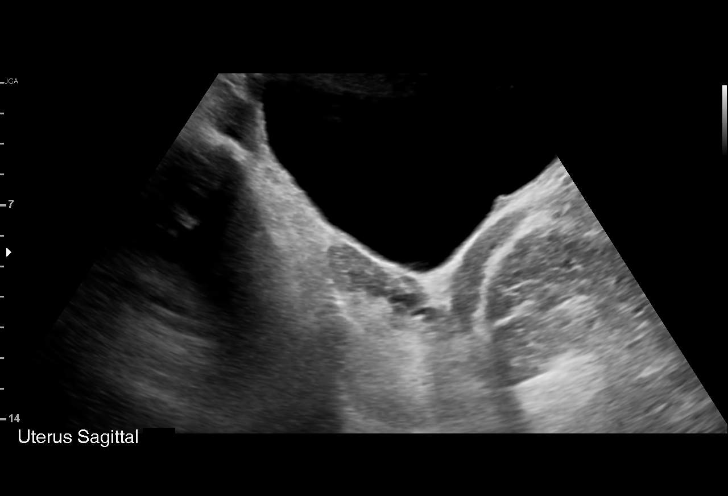
[im 18/83]
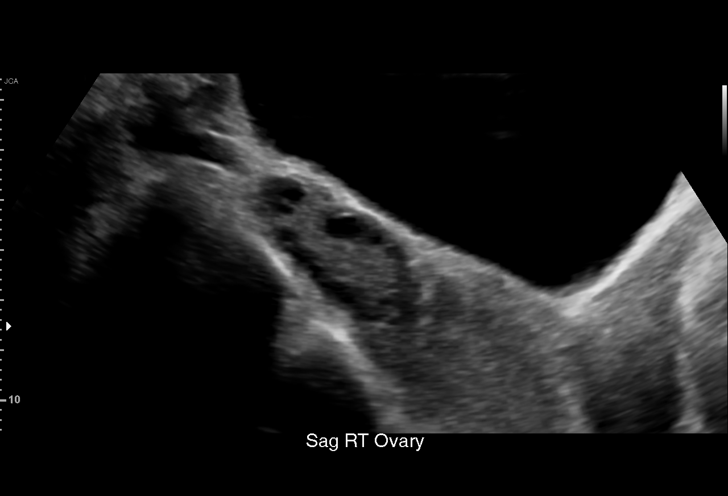
[im 24/83]
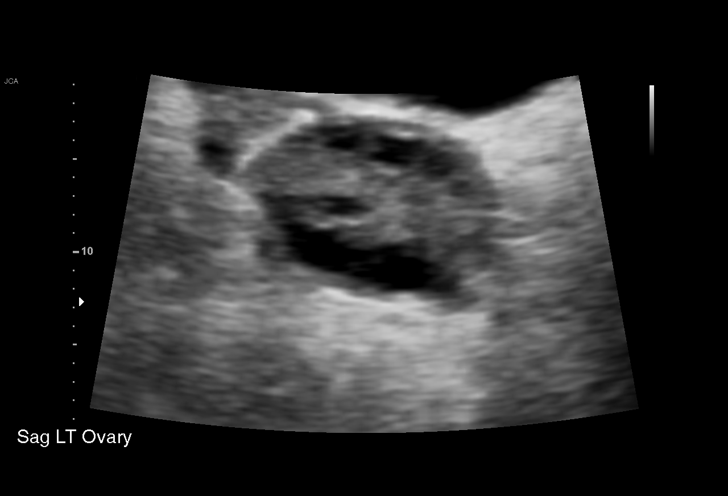
[im 31/83]
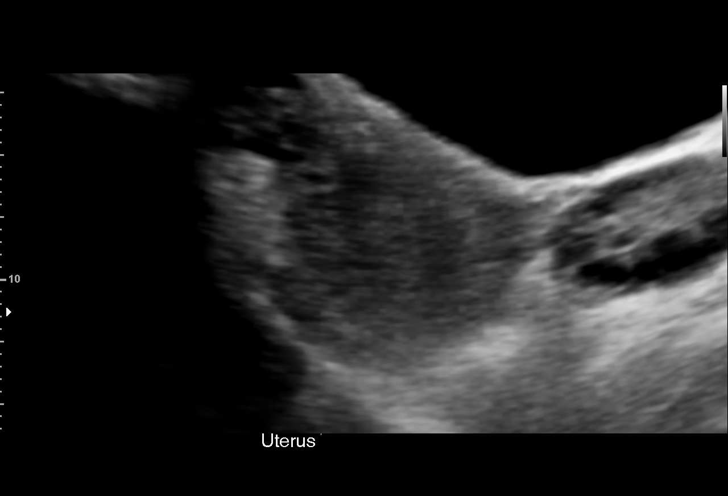
[im 35/83]
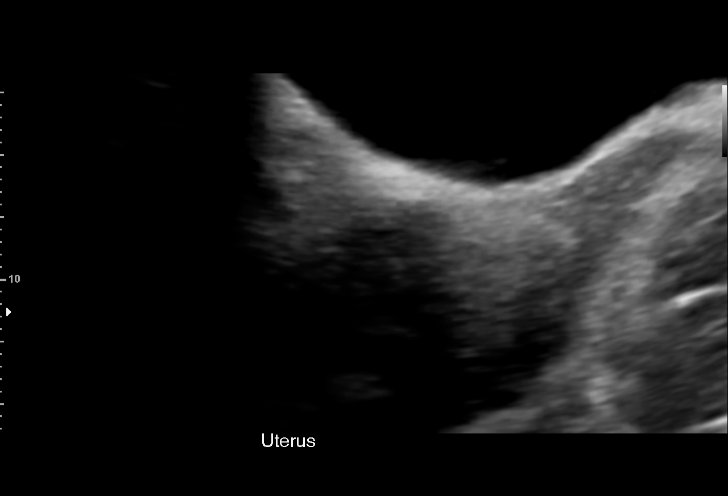
[im 42/83]
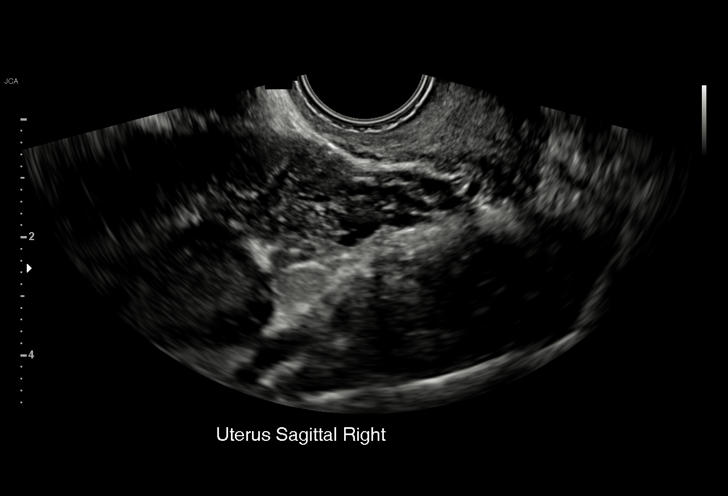
[im 48/83]
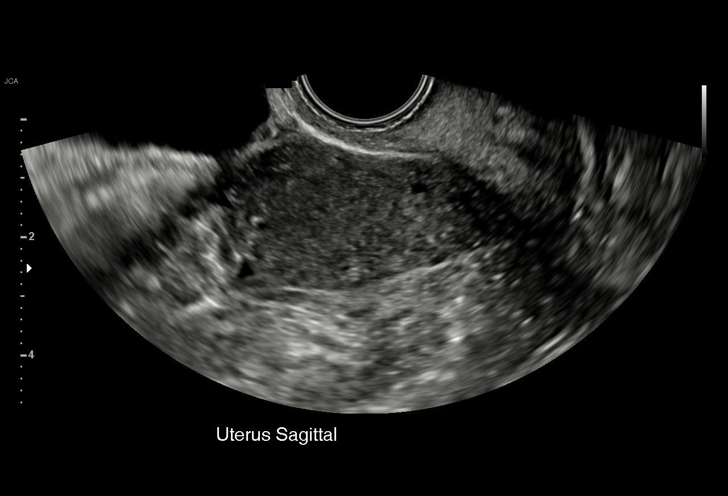
[im 52/83]
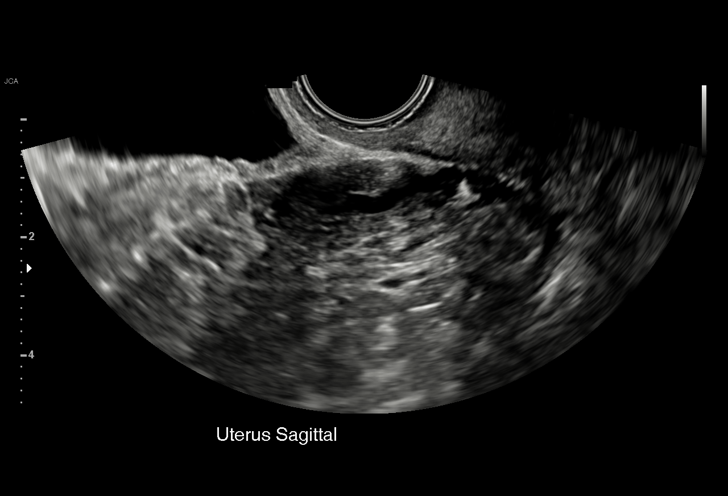
[im 59/83]
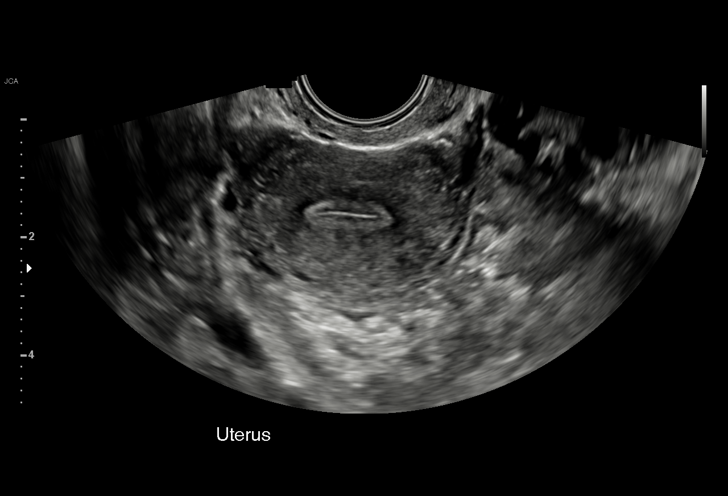
[im 65/83]
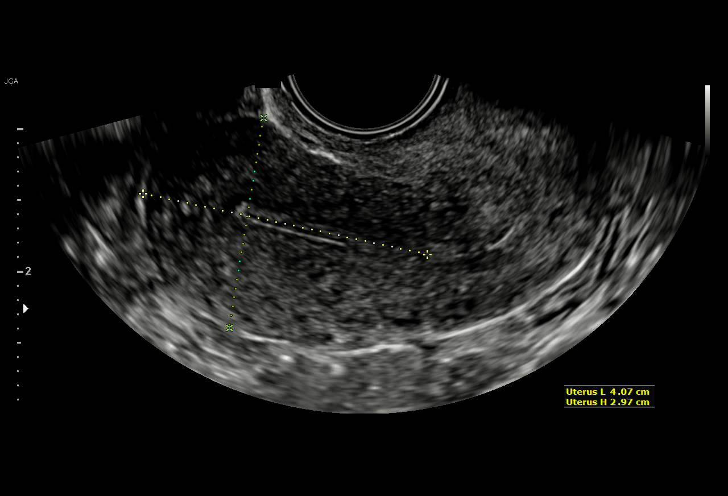
[im 69/83]
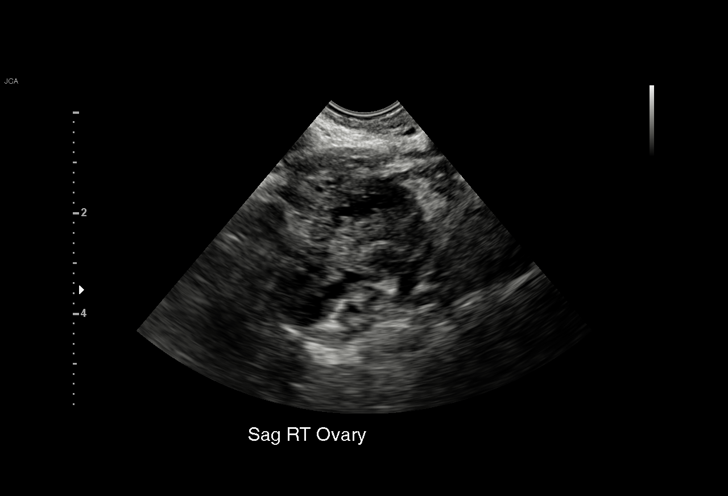
[im 76/83]
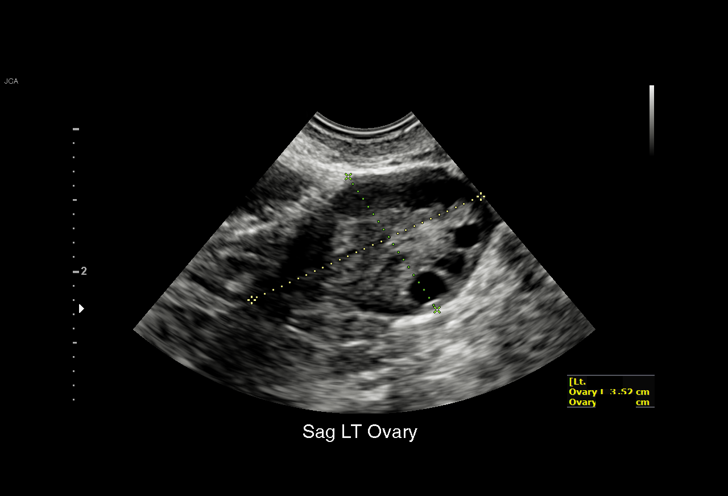
[im 83/83]
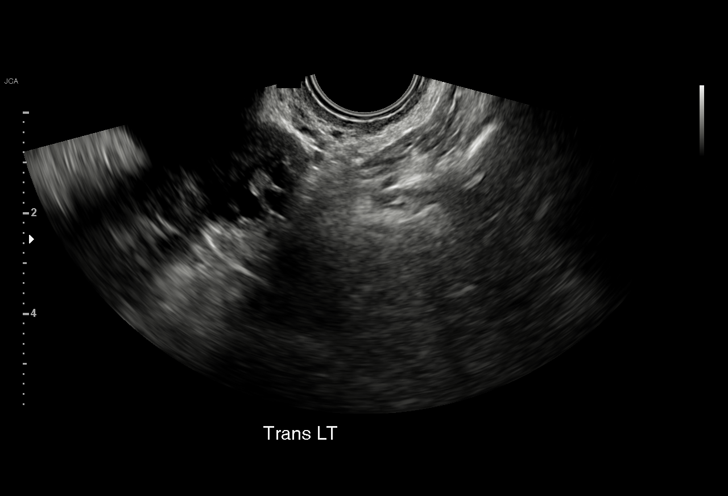

[15 of 25 positions shown; findings below may reference images not displayed]

FINDINGS: Uterus

Measurements: 6.9 x 3.2 x 4.7 cm = volume: 54 mL. Anteverted. Normal
morphology without mass

Endometrium

Thickness: 3 mm.  No endometrial fluid or focal abnormality

Right ovary

Measurements: 4.0 x 2.1 x 2.5 cm = volume: 10.8 mL. Normal
morphology without mass.

Left ovary

Measurements: 3.2 x 2.1 x 3.1 cm = volume: 10.9 mL. Normal
morphology without mass.

Other findings

Trace free pelvic fluid.  No adnexal masses.
IMPRESSION: Normal exam.

## 2021-04-16 ENCOUNTER — Emergency Department (HOSPITAL_COMMUNITY)
Admission: EM | Admit: 2021-04-16 | Discharge: 2021-04-16 | Disposition: A | Discharge status: Home / Self Care | Payer: Medicaid Other | Attending: Emergency Medicine | Admitting: Emergency Medicine

## 2021-04-16 ENCOUNTER — Other Ambulatory Visit: Payer: Self-pay

## 2021-04-16 ENCOUNTER — Encounter (HOSPITAL_COMMUNITY): Payer: Self-pay

## 2021-04-16 DIAGNOSIS — O210 Mild hyperemesis gravidarum: Secondary | ICD-10-CM | POA: Diagnosis present

## 2021-04-16 DIAGNOSIS — O219 Vomiting of pregnancy, unspecified: Secondary | ICD-10-CM

## 2021-04-16 DIAGNOSIS — Z3A01 Less than 8 weeks gestation of pregnancy: Secondary | ICD-10-CM | POA: Insufficient documentation

## 2021-04-16 LAB — COMPREHENSIVE METABOLIC PANEL
ALT: 12 U/L (ref 0–44)
AST: 16 U/L (ref 15–41)
Albumin: 3.9 g/dL (ref 3.5–5.0)
Alkaline Phosphatase: 59 U/L (ref 38–126)
Anion gap: 7 (ref 5–15)
BUN: 6 mg/dL (ref 6–20)
CO2: 25 mmol/L (ref 22–32)
Calcium: 9.5 mg/dL (ref 8.9–10.3)
Chloride: 102 mmol/L (ref 98–111)
Creatinine, Ser: 0.66 mg/dL (ref 0.44–1.00)
GFR, Estimated: >60 mL/min (ref >60)
Glucose, Bld: 132 mg/dL — ABNORMAL HIGH (ref 70–99)
Potassium: 3.5 mmol/L (ref 3.5–5.1)
Sodium: 134 mmol/L — ABNORMAL LOW (ref 135–145)
Total Bilirubin: 0.8 mg/dL (ref 0.3–1.2)
Total Protein: 7.3 g/dL (ref 6.5–8.1)

## 2021-04-16 LAB — CBC
HCT: 40.5 % (ref 36.0–46.0)
Hemoglobin: 13.6 g/dL (ref 12.0–15.0)
MCH: 29.2 pg (ref 26.0–34.0)
MCHC: 33.6 g/dL (ref 30.0–36.0)
MCV: 87.1 fL (ref 80.0–100.0)
Platelets: 275 10*3/uL (ref 150–400)
RBC: 4.65 MIL/uL (ref 3.87–5.11)
RDW: 14 % (ref 11.5–15.5)
WBC: 14.6 10*3/uL — ABNORMAL HIGH (ref 4.0–10.5)
nRBC: 0 % (ref 0.0–0.2)

## 2021-04-16 LAB — I-STAT BETA HCG BLOOD, ED (MC, WL, AP ONLY): I-stat hCG, quantitative: >2000 m[IU]/mL — ABNORMAL HIGH (ref <5)

## 2021-04-16 LAB — URINALYSIS, ROUTINE W REFLEX MICROSCOPIC
Bilirubin Urine: NEGATIVE
Glucose, UA: NEGATIVE mg/dL
Hgb urine dipstick: NEGATIVE
Ketones, ur: NEGATIVE mg/dL
Leukocytes,Ua: NEGATIVE
Nitrite: NEGATIVE
Protein, ur: NEGATIVE mg/dL
Specific Gravity, Urine: 1.011 (ref 1.005–1.030)
pH: 6 (ref 5.0–8.0)

## 2021-04-16 MED ORDER — PROMETHAZINE HCL 25 MG PO TABS: 25.0000 mg | ORAL_TABLET | Freq: Four times a day (QID) | ORAL | 0 refills | Status: DC | PRN

## 2021-04-16 NOTE — ED Triage Notes (Signed)
Pt reports she is 103w1d pregnant, G4P1, not yet followed by OB. C/o constant nausea and vomiting for two days. Denies abdominal pain, no vaginal bleeding.

## 2021-04-16 NOTE — ED Provider Notes (Signed)
[facility] EMERGENCY DEPARTMENT Provider Note   CSN: [id number] Arrival date & time: 04/16/21  1844     History Chief Complaint  Patient presents with   Nausea    Ashley Payne is a 30 y.o. female.  HPI 30 year old female presents with nausea and pressure menstrual pregnancy.  This is her fourth pregnancy, she has 1 baby and 2 failed pregnancies.  She is approximately [redacted] weeks pregnant.  She states she is been nauseated for 2 days without vomiting.  No abdominal pain or vaginal bleeding.  She is looking for an antiemetic to help.  She otherwise denies illness including no fevers.  Past Medical History:  Diagnosis Date   Medical history non-contributory     Patient Active Problem List   Diagnosis Date Noted   Language barrier 06/21/2019   History of recurrent miscarriages 05/14/2019    Past Surgical History:  Procedure Laterality Date   NO PAST SURGERIES       OB History     Gravida  2   Para  0   Term  0   Preterm      AB  2   Living         SAB  2   IAB      Ectopic      Multiple      Live Births              Family History  Problem Relation Age of Onset   Healthy Mother    Healthy Father     Social History   Tobacco Use   Smoking status: Never   Smokeless tobacco: Never  Vaping Use   Vaping Use: Never used  Substance Use Topics   Alcohol use: No   Drug use: Never    Home Medications Prior to Admission medications   Medication Sig Start Date End Date Taking? Authorizing Provider  promethazine (PHENERGAN) 25 MG tablet Take 1 tablet (25 mg total) by mouth every 6 (six) hours as needed for nausea or vomiting. 04/16/21  Yes Pricilla Loveless, MD  acetaminophen (TYLENOL) 500 MG tablet Take 500 mg by mouth every 6 (six) hours as needed.    [provider]  famotidine (PEPCID) 20 MG tablet Take 1 tablet (20 mg total) by mouth 2 (two) times daily. Patient not taking: Reported on 05/12/2019 05/06/19  06/09/19  Wallis Bamberg, PA-C  norgestimate-ethinyl estradiol (ORTHO-CYCLEN) 0.25-35 MG-MCG tablet Take 1 tablet by mouth daily. 05/12/19 06/09/19  Rasch, Harolyn Rutherford, NP    Allergies    Patient has no known allergies.  Review of Systems   Review of Systems  Constitutional:  Negative for fever.  Gastrointestinal:  Positive for nausea. Negative for abdominal pain and vomiting.  Genitourinary:  Positive for menstrual problem. Negative for dysuria and vaginal discharge.  Musculoskeletal:  Negative for back pain.  All other systems reviewed and are negative.  Physical Exam Updated Vital Signs BP 138/80   Pulse 91   Temp 98.8 F (37.1 C)   Resp 16   Ht 5\' 3"  (1.6 m)   Wt 73.5 kg   SpO2 100%   BMI 28.70 kg/m   Physical Exam Vitals and nursing note reviewed.  Constitutional:      Appearance: She is well-developed.  HENT:     Head: Normocephalic and atraumatic.     Right Ear: External ear normal.     Left Ear: External ear normal.     Nose: Nose normal.  Eyes:     General:        Right eye: No discharge.        Left eye: No discharge.  Cardiovascular:     Rate and Rhythm: Normal rate and regular rhythm.     Heart sounds: Normal heart sounds.  Pulmonary:     Effort: Pulmonary effort is normal.     Breath sounds: Normal breath sounds.  Abdominal:     Palpations: Abdomen is soft.     Tenderness: There is no abdominal tenderness.  Skin:    General: Skin is warm and dry.  Neurological:     Mental Status: She is alert.  Psychiatric:        Mood and Affect: Mood is not anxious.    ED Results / Procedures / Treatments   Labs (all labs ordered are listed, but only abnormal results are displayed) Labs Reviewed  COMPREHENSIVE METABOLIC PANEL - Abnormal; Notable for the following components:      Result Value   Sodium 134 (*)    Glucose, Bld 132 (*)    All other components within normal limits  CBC - Abnormal; Notable for the following components:   WBC 14.6 (*)    All other  components within normal limits  URINALYSIS, ROUTINE W REFLEX MICROSCOPIC - Abnormal; Notable for the following components:   APPearance HAZY (*)    All other components within normal limits  I-STAT BETA HCG BLOOD, ED (MC, WL, AP ONLY) - Abnormal; Notable for the following components:   I-stat hCG, quantitative >2,000.0 (*)    All other components within normal limits    EKG None  Radiology No results found.  Procedures Procedures   Medications Ordered in ED Medications - No data to display  ED Course  I have reviewed the triage vital signs and the nursing notes.  Pertinent labs & imaging results that were available during my care of the patient were reviewed by me and considered in my medical decision making (see chart for details).    MDM Rules/Calculators/A&P                           We will refer to outpatient OB/GYN but with no abdominal complaints besides nausea I do not think emergent ultrasound is needed.  Lab work does confirm that she is pregnant.  Benign exam currently.  Will give Phenergan Rx and have her follow-up with OB.  Given return precautions. Final Clinical Impression(s) / ED Diagnoses Final diagnoses:  Vomiting or nausea of pregnancy    Rx / DC Orders ED Discharge Orders          Ordered    promethazine (PHENERGAN) 25 MG tablet  Every 6 hours PRN        04/16/21 2150             Pricilla Loveless, MD 04/16/21 2240

## 2021-04-16 NOTE — Discharge Instructions (Addendum)
If you develop intractable vomiting, fever, abdominal pain, vaginal bleeding, or any other new/concerning symptoms, then return to the ER for evaluation  Si desarrolla vmitos intratables, fiebre, dolor abdominal, sangrado vaginal o cualquier otro sntoma nuevo o preocupante, regrese a la sala de emergencias para una evaluacin.

## 2021-04-16 NOTE — ED Notes (Signed)
Patient verbalizes understanding of discharge instructions. Prescriptions and follow-up care with OB/GYN reviewed. Opportunity for questioning and answers were provided. Armband removed by staff, pt discharged from ED ambulatory.

## 2021-04-30 ENCOUNTER — Other Ambulatory Visit: Payer: Self-pay

## 2021-04-30 ENCOUNTER — Ambulatory Visit (INDEPENDENT_AMBULATORY_CARE_PROVIDER_SITE_OTHER): Payer: Medicaid Other | Admitting: Advanced Practice Midwife

## 2021-04-30 ENCOUNTER — Encounter: Payer: Self-pay | Admitting: Advanced Practice Midwife

## 2021-04-30 ENCOUNTER — Encounter: Payer: Self-pay | Admitting: General Practice

## 2021-04-30 ENCOUNTER — Other Ambulatory Visit (HOSPITAL_COMMUNITY)
Admission: RE | Admit: 2021-04-30 | Discharge: 2021-04-30 | Disposition: A | Discharge status: Home / Self Care | Payer: Medicaid Other | Source: Ambulatory Visit | Attending: Obstetrics and Gynecology | Admitting: Obstetrics and Gynecology

## 2021-04-30 ENCOUNTER — Ambulatory Visit (INDEPENDENT_AMBULATORY_CARE_PROVIDER_SITE_OTHER): Payer: Medicaid Other

## 2021-04-30 DIAGNOSIS — Z3A08 8 weeks gestation of pregnancy: Secondary | ICD-10-CM | POA: Diagnosis not present

## 2021-04-30 DIAGNOSIS — O0990 Supervision of high risk pregnancy, unspecified, unspecified trimester: Secondary | ICD-10-CM | POA: Insufficient documentation

## 2021-04-30 DIAGNOSIS — Z23 Encounter for immunization: Secondary | ICD-10-CM

## 2021-04-30 DIAGNOSIS — Z3A09 9 weeks gestation of pregnancy: Secondary | ICD-10-CM | POA: Diagnosis not present

## 2021-04-30 DIAGNOSIS — O0991 Supervision of high risk pregnancy, unspecified, first trimester: Secondary | ICD-10-CM | POA: Diagnosis not present

## 2021-04-30 DIAGNOSIS — Z98891 History of uterine scar from previous surgery: Secondary | ICD-10-CM

## 2021-04-30 MED ORDER — ONDANSETRON HCL 4 MG PO TABS: 4.0000 mg | ORAL_TABLET | Freq: Three times a day (TID) | ORAL | 0 refills | Status: DC | PRN

## 2021-04-30 MED ORDER — DOXYLAMINE-PYRIDOXINE 10-10 MG PO TBEC: 2.0000 | DELAYED_RELEASE_TABLET | Freq: Every evening | ORAL | 5 refills | Status: DC | PRN

## 2021-04-30 NOTE — Patient Instructions (Signed)
Primer trimestre de Media planner First Trimester of Pregnancy El primer trimestre de Insurance risk surveyor de su ltimo periodo menstrual hasta el final de la semana 12. Tambin se dice que va desde el mes 1 hasta el mes 3 de [place]. Durante Software engineer trimestre, ocurren cambios en el cuerpo Su organismo atraviesa por muchos cambios durante el [place]. En general, los cambios vuelven a la normalidad despus del nacimiento del beb. Cambios fsicos Usted puede aumentar o bajar de [place]. Las mamas pueden agrandarse y Secretary/administrator. La zona que rodea los pezones puede oscurecerse. Pueden aparecer zonas oscuras o manchas en el rostro. Tal vez haya cambios en el cabello. Cambios en la salud Es posible que se sienta como si fuera a vomitar (nuseas) y quizs vomite. Es posible que tenga Merchant navy officer. Es posible que tenga dolores de [place]. Es posible que tenga dificultades para defecar (estreimiento). Las encas pueden sangrarle. Otros cambios Es posible que se canse con facilidad. Es posible que haga pis (orine) con mayor frecuencia. Los perodos menstruales se interrumpirn. Es posible que no tenga hambre. Es posible que [place] comer ciertos tipos de alimentos. Puede tener cambios en sus emociones de un da para Lexicographer. Es posible que tenga ms sueos. Siga estas instrucciones en su casa: Medicamentos Use los medicamentos de venta libre y los recetados solamente como se lo haya indicado el mdico. Algunos medicamentos no son seguros Solicitor. Tome vitaminas prenatales que contengan por lo menos 600 microgramos (mcg) de cido flico. Comida y bebida Consuma comidas saludables que incluyan lo siguiente: Lambert Mody y verduras frescas. Cereales integrales. Buenas fuentes de protenas, como carne, huevos y tofu. Productos lcteos con bajo contenido de [place]. Evite la carne cruda y el [place], la [place] y el queso sin Radio producer. Si se siente como si fuera a vomitar: Ingiera 4 o 5  comidas pequeas por TEFL teacher de 3 abundantes. Intente comer algunas galletitas saladas. Beba lquidos [employer], en lugar de Psychiatrist. Es posible que deba tomar medidas para prevenir o tratar los problemas para defecar: Electronics engineer suficiente lquido para Contractor pis (orina) de color amarillo plido. Come alimentos ricos en fibra. Entre ellos, frijoles, cereales integrales y frutas y verduras frescas. Limitar los alimentos con alto contenido de grasa y Location manager. Estos incluyen alimentos fritos o dulces. Actividad Haga ejercicios solamente como se lo haya indicado el mdico. La mayora de las personas pueden realizar su rutina de ejercicios habitual durante el [place]. Deje de hacer ejercicios si tiene clicos o dolor en la parte baja del vientre (abdomen) o en la cintura. No haga ejercicio si hace demasiado calor, hay demasiada humedad o se encuentra en un lugar de mucha altura (altitud elevada). Evite levantar pesos [employer]. Si lo desea, puede continuar teniendo [employer], a menos que el mdico le indique lo contrario. Alivio del dolor y del Tree surgeon Use un sostn que le brinde buen soporte si le duelen las [place]. Descanse con las piernas levantadas (elevadas) si tiene calambres en las piernas o dolor en la parte baja de la espalda. Si tiene las venas de las piernas abultadas (venas varicosas): Use medias de compresin segn las indicaciones de su mdico. Levante los pies durante 15 minutos, 3 o 4 veces por Training and development officer. Limite la sal en sus alimentos. Seguridad Use el cinturn de seguridad en todo momento mientras vaya en auto. Hable con el mdico si alguien le est haciendo dao o gritando [place]. Hable con el mdico si se siente triste o  tiene pensamientos acerca de hacerse dao a usted misma. Estilo de vida No se d baos de inmersin en agua caliente, baos turcos ni saunas. No se haga duchas vaginales. No use tampones ni toallas higinicas perfumadas. No  consuma medicamentos a base de hierbas, drogas ilegales, ni medicamentos que el mdico no haya autorizado. No beba alcohol. No fume ni consuma ningn producto que contenga nicotina o tabaco. Si necesita ayuda para dejar de fumar, consulte al mdico. Evite el contacto con las bandejas sanitarias de los gatos y la tierra que estos animales usan. Estos contienen grmenes que pueden daar al beb y causar la prdida del beb ya sea aborto espontneo o muerte fetal. Instrucciones generales Cumpla con todas las visitas de seguimiento. Esto es importante. Pida ayuda si necesita asesoramiento o asistencia con la alimentacin. El mdico puede aconsejarla o indicarle dnde recurrir para recibir [place]. Visite al dentista. En su casa, lvese los dientes con un cepillo dental suave. Psese el hilo dental suavemente. Escriba sus preguntas. Llvelas cuando concurra a las visitas prenatales. Dnde buscar ms informacin American Pregnancy Association (Asociacin Americana del Embarazo): americanpregnancy.org Cel[employer] Corporation of Obstetricians and Gynecologists (Colegio Estadounidense de Obstetras y Gineclogos): www.acog.org Office on [employer] (Oficina para la Salud de la Mujer): MightyReward.co.nz Comunquese con un mdico si: Tiene mareos. Tiene fiebre. Tiene clicos leves o siente presin en la parte baja del vientre. Sufre un dolor persistente en el abdomen. Sigue sintiendo como si fuera a vomitar, vomita o hace deposiciones acuosas (diarrea) durante 24 horas o ms. Advierte lquido con mal olor que proviene de la vagina. Siente dolor al [employer]oco[employer]. Se expone a una enfermedad que se transmite de [place] persona a otra, como varicela, sarampin, virus de [place], VIH o hepatitis. Solicite ayuda de inmediato si: Tiene sangrado o pequeas prdidas vaginales. Tiene clicos o dolor muy intensos en el vientre. Le falta el aire o le duele el pecho. Sufre cualquier tipo de lesin, por ejemplo, debido a una  cada o un accidente automovilstico. Tiene dolor, hinchazn o enrojecimiento nuevos en un brazo o una pierna o se produce un aumento de alguno de estos sntomas. Resumen Financial risk analyst trimestre del Community education officer de su ltimo periodo menstrual hasta el final de la semana 12 (meses 1 al 3). Ingiera 4 o 5 comidas pequeas por Geophysical data processor de 3 abundantes. No fume ni consuma ningn producto que contenga nicotina o tabaco. Si necesita ayuda para dejar de fumar, consulte al mdico. Cumpla con todas las visitas de seguimiento. Esta informacin no tiene Theme park manager el consejo del mdico. Asegrese de hacerle al mdico cualquier pregunta que tenga. Document Revised: 11/25/2019 Document Reviewed: 11/25/2019 Elsevier Patient Education  2022 Elsevier Inc. Nuseas matinales Morning Sickness Se denominan nuseas matinales a las ganas de vomitar (nuseas) durante el Psychiatrist. A veces, usted puede vomitar. Las nuseas matutinas suelen ocurrir por la maana, pero tambin pueden presentarse en cualquier momento del da. Algunas mujeres pueden tener nuseas matinales que las hacen vomitar todo el [place]. Este es un problema ms grave que necesita tratamiento. Cules son las causas? Se desconoce la causa de esta afeccin. Qu incrementa el riesgo? Tena vmitos o ganas de vomitar antes del embarazo. Tuvo nuseas matinales en otro embarazo. Est embarazada de ms de un beb, por ejemplo, mellizos. Cules son los signos o sntomas? Ganas de vomitar. Vmitos. Cmo se trata? Generalmente, no se requiere un tratamiento para este trastorno. Puede que solo necesite cambiar lo que come. En algunos casos, el  mdico puede darle algunas cosas que tomar para su afeccin. Estos incluyen: Suplementos de vitamina B6. Medicamentos para tratar las ganas de vomitar. Jengibre. Siga estas instrucciones en su casa: Medicamentos Use los medicamentos de venta libre y los recetados solamente como se lo  haya indicado el mdico. No tome ningn medicamento hasta no hablar primero con el mdico al [employer]. Tome multivitaminas antes de quedarse embarazada. Estas pueden detener o reducir los sntomas de nuseas matinales. Comida y bebida Coma un trozo de [place] seca o galletas antes de levantarse de la cama. Coma 5 o 6 comidas pequeas por da. Consuma alimentos blandos y secos como arroz y patatas asadas. No coma alimentos fritos, grasos o muy condimentados. Pdale a alguna persona que cocine por usted si el Charles Schwab de las [employer] da vmitos o ganas de Biochemist, clinical. Si tiene ganas de vomitar despus de tomar las vitaminas prenatales, tmelas a la noche o con un refrigerio. Consuma alimentos proteicos cuando necesite un refrigerio. Los frutos secos, el yogur y el queso son buenas opciones. Beba lquidos durante todo Mellon Financial. Pruebe a tomar gaseosa de jengibre hecha con jengibre natural, t de jengibre hecho con jengibre fresco rallado o caramelos de jengibre. Instrucciones generales No fume ni consuma ningn producto que contenga nicotina o tabaco. Si necesita ayuda para dejar de fumar, consulte al mdico. Use un purificador de aire para mantener el aire de la casa sin olores. Trate de respirar Engineer, site. Intente evitar los olores que la hacen sentir descompuesta. Pruebe usar una pulsera de acupresin. Se trata de una pulsera que se [place] para tratar Microsoft. Pruebe un tratamiento llamado acupuntura. En este tratamiento, un mdico le coloca agujas en ciertas zonas del cuerpo para hacerla sentir mejor. Comunquese con un mdico si: Necesita medicamentos para sentirse mejor. Se siente mareada o siente que va a desvanecerse. Pierde peso. Solicite ayuda de inmediato si: La sensacin de que puede vomitar no desaparece o no puede dejar de vomitar. Se desmaya. Tiene dolor muy intenso en el abdomen. Resumen Se denominan nuseas matinales a las ganas de vomitar (nuseas) durante el Psychiatrist. Puede  sentir nuseas por la maana, pero tambin en cualquier momento del da. Hacer algunos cambios en la alimentacin puede ayudar que los sntomas desaparezcan. Esta informacin no tiene Theme park manager el consejo del mdico. Asegrese de hacerle al mdico cualquier pregunta que tenga. Document Revised: 01/25/2020 Document Reviewed: 01/25/2020 Elsevier Patient Education  2022 ArvinMeritor.

## 2021-04-30 NOTE — Progress Notes (Signed)
INITIAL OBSTETRICAL VISIT Patient name: Ashley Payne MRN [id number]  Date of birth: April 23, 1991 Chief Complaint:   Initial Prenatal Visit  History of Present Illness:   Ashley Payne is a 30 y.o. G29P1021 Hispanic female at [redacted]w[redacted]d by [place] at 9 weeks with an Estimated Date of Delivery: 12/02/21 being seen today for her initial obstetrical visit.   Her obstetrical history is significant for  Prior Cesarean Delivery (states it was because she had GBS and was told if she had a vaginial delivery the baby would get pneumonia, so chose C/S)   Would like a TOLAC .    Today she reports nausea. Has Phenergan which helps some but not completely  Depression screen [facility] 2/9 11/08/2020 07/07/2018  Decreased Interest 0 0  Down, Depressed, Hopeless 0 0  PHQ - 2 Score 0 0  Altered sleeping 0 -  Tired, decreased energy 0 -  Change in appetite 0 -  Feeling bad or failure about yourself  0 -  Trouble concentrating 0 -  Moving slowly or fidgety/restless 0 -  Suicidal thoughts 0 -  PHQ-9 Score 0 -  Difficult doing work/chores Not difficult at all -    Patient's last menstrual period was 02/25/2021. Last pap not sure. Results were: normal Review of Systems:   Pertinent items are noted in HPI Denies cramping/contractions, leakage of fluid, vaginal bleeding, abnormal vaginal discharge w/ itching/odor/irritation, headaches, visual changes, shortness of breath, chest pain, abdominal pain, severe nausea/vomiting, or problems with urination or bowel movements unless otherwise stated above.  Pertinent History Reviewed:  Reviewed past medical,surgical, social, obstetrical and family history.  Reviewed problem list, medications and allergies. OB History  Gravida Para Term Preterm AB Living  4 1 1   2 1   SAB IAB Ectopic Multiple Live Births  2       1    # Outcome Date GA Lbr Len/2nd Weight Sex Delivery Anes PTL Lv  4 Current           3 Term 03/30/20 [redacted]w[redacted]d    CS-LTranv EPI N LIV  2 SAB 04/22/19 [redacted]w[redacted]d          1 SAB 2018 [redacted]w[redacted]d          Physical Assessment:   Vitals:   04/30/21 0847  BP: (!) 93/59  Pulse: 75  Weight: 171 lb (77.6 kg)  Body mass index is 30.29 kg/m.       Physical Examination:  General appearance - well appearing, and in no distress  Mental status - alert, oriented to person, place, and time  Psych:  She has a normal mood and affect  Skin - warm and dry, normal color, no suspicious lesions noted  Chest - effort normal, all lung fields clear to auscultation bilaterally  Heart - normal rate and regular rhythm  Abdomen - soft, nontender  Extremities:  No swelling or varicosities noted  Pelvic - VULVA: normal appearing vulva with no masses, tenderness or lesions  VAGINA: normal appearing vagina with normal color and discharge, no lesions  CERVIX: normal appearing cervix without discharge or lesions, no CMT  Thin prep pap is done  HR HPV cotesting  Chaperone:  05/02/21 RN       No results found for this or any previous visit (from the past 24 hour(s)).  Assessment & Plan:  1) High -Risk Pregnancy G4P1021 at [redacted]w[redacted]d with an Estimated Date of Delivery: 12/02/21   2) Initial OB visit  3) Prior C/S delivery, desires TOLAC  Meds:  Meds ordered this encounter  Medications   Doxylamine-Pyridoxine (DICLEGIS) 10-10 MG TBEC    Sig: Take 2 tablets by mouth at bedtime as needed (nausea).    Dispense:  60 tablet    Refill:  5    Take 2 tablets at bedtime. If symptoms persist, add 1 tab in the AM starting on day 3. If symptoms persist, add 1 tab in the PM starting day 4. Speaks Spanish    Order Specific Question:   Supervising Provider    Answer:   Janyth Pupa [id number]    Initial labs obtained Continue prenatal vitamins Reviewed n/v relief measures and warning s/s to report Reviewed recommended weight gain based on pre-gravid BMI Encouraged well-balanced diet Genetic & carrier screening discussed: requests Panorama and Horizon , requests Panorama and Horizon   Ultrasound discussed; fetal survey: requested CCNC completed> form faxed if has or is planning to apply for medicaid The nature of [facility] for [employer] with multiple MDs and other Advanced Practice Providers was explained to patient; also emphasized that fellows, residents, and students are part of our team.   Indications for ASA therapy (per uptodate) One of the following: H/O preeclampsia, especially early onset/adverse outcome No Multifetal gestation No CHTN No T1DM or T2DM No Chronic kidney disease No Autoimmune disease (antiphospholipid syndrome, systemic lupus erythematosus) No  OR Two or more of the following: Nulliparity No Obesity (BMI>30 kg/m2) No Family h/o preeclampsia in mother or sister No Age ?35 years No Sociodemographic characteristics (African American race, low socioeconomic level) No Personal risk factors (eg, previous pregnancy w/ LBW or SGA, previous adverse pregnancy outcome [eg, stillbirth], interval >10 years between pregnancies) No  Indications for early A1C (per uptodate) BMI >=25 (>=23 in Asian women) AND one of the following GDM in a previous pregnancy No Previous A1C?5.7, impaired glucose tolerance, or impaired fasting glucose on previous testing No First-degree relative with diabetes No High-risk race/ethnicity (eg, African American, Latino, Native American, Asian American, Pacific Islander) Yes History of cardiovascular disease No HTN or on therapy for hypertension No HDL cholesterol level <35 mg/dL (0.90 mmol/L) and/or a triglyceride level >250 mg/dL (2.82 mmol/L) No PCOS No Physical inactivity No Other clinical condition associated with insulin resistance (eg, severe obesity, acanthosis nigricans) No Previous birth of an infant weighing ?4000 g No Previous stillbirth of unknown cause No >= 40yo No  Follow-up: Return in about 4 weeks (around 05/28/2021), or 4-5 wks OK, for [employer].   Orders Placed This  Encounter  Procedures   Culture, OB Urine   [place] MFM OB DETAIL +14 WK   US OB Limited   Flu Vaccine QUAD 84mo+IM (Fluarix, Fluzone & Alfiuria Quad PF)   CBC/D/Plt+RPR+Rh+ABO+RubIgG...    Hansel Feinstein CNM 04/30/2021 1:48 PM

## 2021-04-30 NOTE — Progress Notes (Signed)
CAP interpreter [employer].

## 2021-05-01 ENCOUNTER — Encounter: Payer: Self-pay | Admitting: Advanced Practice Midwife

## 2021-05-01 LAB — CBC/D/PLT+RPR+RH+ABO+RUBIGG...
Antibody Screen: NEGATIVE
Basophils Absolute: 0.1 10*3/uL (ref 0.0–0.2)
Basos: 1 %
EOS (ABSOLUTE): 0.1 10*3/uL (ref 0.0–0.4)
Eos: 1 %
HCV Ab: <0.1 s/co ratio (ref 0.0–0.9)
HIV Screen 4th Generation wRfx: NONREACTIVE
Hematocrit: 42.3 % (ref 34.0–46.6)
Hemoglobin: 13.9 g/dL (ref 11.1–15.9)
Hepatitis B Surface Ag: NEGATIVE
Immature Grans (Abs): 0.1 10*3/uL (ref 0.0–0.1)
Immature Granulocytes: 1 %
Lymphocytes Absolute: 1.5 10*3/uL (ref 0.7–3.1)
Lymphs: 13 %
MCH: 28.8 pg (ref 26.6–33.0)
MCHC: 32.9 g/dL (ref 31.5–35.7)
MCV: 88 fL (ref 79–97)
Monocytes Absolute: 0.5 10*3/uL (ref 0.1–0.9)
Monocytes: 5 %
Neutrophils Absolute: 9.7 10*3/uL — ABNORMAL HIGH (ref 1.4–7.0)
Neutrophils: 79 %
Platelets: 300 10*3/uL (ref 150–450)
RBC: 4.82 x10E6/uL (ref 3.77–5.28)
RDW: 13.7 % (ref 11.7–15.4)
RPR Ser Ql: NONREACTIVE
Rh Factor: POSITIVE
Rubella Antibodies, IGG: 13.5 index (ref >0.99)
WBC: 12 10*3/uL — ABNORMAL HIGH (ref 3.4–10.8)

## 2021-05-01 LAB — CYTOLOGY - PAP
Chlamydia: NEGATIVE
Comment: NEGATIVE
Comment: NEGATIVE
Comment: NORMAL
Diagnosis: NEGATIVE
High risk HPV: NEGATIVE
Neisseria Gonorrhea: NEGATIVE

## 2021-05-01 LAB — HCV INTERPRETATION

## 2021-05-02 LAB — CULTURE, OB URINE

## 2021-05-02 LAB — URINE CULTURE, OB REFLEX

## 2021-05-09 ENCOUNTER — Encounter: Payer: Medicaid Other | Admitting: Obstetrics and Gynecology

## 2021-05-10 ENCOUNTER — Ambulatory Visit: Payer: Medicaid Other | Attending: Nurse Practitioner | Admitting: Nurse Practitioner

## 2021-05-10 ENCOUNTER — Other Ambulatory Visit: Payer: Self-pay

## 2021-05-10 NOTE — Progress Notes (Signed)
Patient is requesting leave of absence from her employer due to symptoms of pregnancy. Will need forms filled out by her OB GYN

## 2021-05-30 ENCOUNTER — Other Ambulatory Visit: Payer: Self-pay

## 2021-05-30 ENCOUNTER — Ambulatory Visit (INDEPENDENT_AMBULATORY_CARE_PROVIDER_SITE_OTHER): Payer: Medicaid Other | Admitting: Family Medicine

## 2021-05-30 VITALS — BP 112/65 | HR 94 | Wt 170.1 lb

## 2021-05-30 DIAGNOSIS — Z98891 History of uterine scar from previous surgery: Secondary | ICD-10-CM

## 2021-05-30 DIAGNOSIS — Z3A13 13 weeks gestation of pregnancy: Secondary | ICD-10-CM

## 2021-05-30 DIAGNOSIS — O0990 Supervision of high risk pregnancy, unspecified, unspecified trimester: Secondary | ICD-10-CM

## 2021-05-30 DIAGNOSIS — Z789 Other specified health status: Secondary | ICD-10-CM

## 2021-05-30 MED ORDER — ONDANSETRON HCL 4 MG PO TABS: 4.0000 mg | ORAL_TABLET | Freq: Three times a day (TID) | ORAL | 0 refills | Status: DC | PRN

## 2021-05-30 NOTE — Progress Notes (Signed)
[employer] language services interpreter [place] [phone]. Diane Mochizuki l Rishon Thilges, CMA

## 2021-05-30 NOTE — Progress Notes (Signed)
° °  PRENATAL VISIT NOTE  Subjective:  Ashley Payne is a 30 y.o. [phone] at [redacted]w[redacted]d being seen today for ongoing prenatal care.  She is currently monitored for the following issues for this low-risk pregnancy and has History of recurrent miscarriages; Language barrier; Supervision of high risk pregnancy, antepartum; and History of cesarean section on their problem list.  Patient reports no complaints.  Contractions: Not present. Vag. Bleeding: None.  Movement: Absent. Denies leaking of fluid.   The following portions of the patient's history were reviewed and updated as appropriate: allergies, current medications, past family history, past medical history, past social history, past surgical history and problem list.   Objective:   Vitals:   05/30/21 1604  BP: 112/65  Pulse: 94  Weight: 170 lb 1.9 oz (77.2 kg)    Fetal Status: Fetal Heart Rate (bpm): 160   Movement: Absent     General:  Alert, oriented and cooperative. Patient is in no acute distress.  Skin: Skin is warm and dry. No rash noted.   Cardiovascular: Normal heart rate noted  Respiratory: Normal respiratory effort, no problems with respiration noted  Abdomen: Soft, gravid, appropriate for gestational age.  Pain/Pressure: Absent     Pelvic: Cervical exam deferred        Extremities: Normal range of motion.  Edema: None  Mental Status: Normal mood and affect. Normal behavior. Normal judgment and thought content.   Assessment and Plan:  Pregnancy: G4P1021 at [redacted]w[redacted]d 1. Supervision of high risk pregnancy, antepartum - Genetic Screening - ondansetron (ZOFRAN) 4 MG tablet; Take 1 tablet (4 mg total) by mouth every 8 (eight) hours as needed for nausea or vomiting.  Dispense: 20 tablet; Refill: 0  2. [redacted] weeks gestation of pregnancy - Genetic Screening  3. History of cesarean section TOLAC discussed and form signed  4. Language barrier Interpreter used  Preterm labor symptoms and general obstetric precautions  including but not limited to vaginal bleeding, contractions, leaking of fluid and fetal movement were reviewed in detail with the patient. Please refer to After Visit Summary for other counseling recommendations.   No follow-ups on file.  Future Appointments  Date Time Provider Department Center  06/27/2021  2:50 PM Levie Heritage, DO [facility] None  07/11/2021  2:30 PM [facility] NURSE [facility]  07/11/2021  2:45 PM [facility] US4 [facility]  07/25/2021  2:50 PM Levie Heritage, DO [facility] None  08/22/2021  2:50 PM Levie Heritage, DO [facility] None    Levie Heritage, DO

## 2021-06-02 NOTE — L&D Delivery Note (Signed)
OB/GYN Faculty Practice Delivery Note  Ashley Payne is a 31 y.o. [id number] s/p VBAC at [redacted]w[redacted]d. She was admitted for SOL/TOLAC.   ROM: 8h 24m with light to moderate meconium fluid GBS Status: Positive/-- (06/08 1508) Maximum Maternal Temperature: 36F  Labor Progress: Initial SVE: 3/60/-2. She then progressed to complete with expectant management and AROM.   Delivery Date/Time: [phone] Delivery: Called to room and patient was complete and pushing. Pushed approximately 3.5 hours. Head delivered L OA. No nuchal cord present. Shoulder and body delivered in usual fashion in transverse positioning so did hook axilla bilaterally to deliver infant. Infant with spontaneous cry, placed on mother's abdomen, dried and stimulated. Cord clamped x 2 after 1-minute delay, and cut by FOB. Cord blood drawn. Placenta delivered spontaneously with gentle cord traction. Fundus firm with massage and Pitocin. Labia, perineum, vagina, and cervix inspected with a right peri-urethral and left labial that were repaired with 4.0 Monocryl in usual fashion.  Baby Weight: pending  Placenta: 3 vessel, intact. Sent to L&D Complications: None Lacerations: Right peri-urethral and left labial EBL: 175 mL Analgesia: Epidural   Infant:  APGAR (1 MIN): 9   APGAR (5 MINS): 9    Leticia Penna, DO  OB Family Medicine Fellow, [facility] for [employer], [facility] 12/05/2021, 4:11 AM

## 2021-06-05 ENCOUNTER — Encounter: Payer: Self-pay | Admitting: General Practice

## 2021-06-06 ENCOUNTER — Encounter: Payer: Self-pay | Admitting: General Practice

## 2021-06-13 ENCOUNTER — Telehealth: Payer: Self-pay

## 2021-06-13 NOTE — Telephone Encounter (Signed)
.  Pt called requesting Panorama results and the gender of her baby. Talked to pt with [facility] interpreter Melida Gimenez [phone], pt made aware that her Cindy Hazy is low risk and she is having a boy. Understanding was voiced. Laurann Mcmorris l Dovey Fatzinger, CMA

## 2021-06-25 ENCOUNTER — Encounter: Payer: Medicaid Other | Admitting: Advanced Practice Midwife

## 2021-06-27 ENCOUNTER — Ambulatory Visit (INDEPENDENT_AMBULATORY_CARE_PROVIDER_SITE_OTHER): Payer: Medicaid Other | Admitting: Family Medicine

## 2021-06-27 ENCOUNTER — Other Ambulatory Visit: Payer: Self-pay

## 2021-06-27 VITALS — BP 110/62 | HR 89 | Wt 173.0 lb

## 2021-06-27 DIAGNOSIS — Z3A17 17 weeks gestation of pregnancy: Secondary | ICD-10-CM

## 2021-06-27 DIAGNOSIS — Z98891 History of uterine scar from previous surgery: Secondary | ICD-10-CM

## 2021-06-27 DIAGNOSIS — O0990 Supervision of high risk pregnancy, unspecified, unspecified trimester: Secondary | ICD-10-CM

## 2021-06-27 DIAGNOSIS — Z789 Other specified health status: Secondary | ICD-10-CM

## 2021-06-27 MED ORDER — FAMOTIDINE 20 MG PO TABS: 20.0000 mg | ORAL_TABLET | Freq: Two times a day (BID) | ORAL | 3 refills | Status: DC

## 2021-06-27 NOTE — Progress Notes (Signed)
Cap interpreter [place].

## 2021-06-27 NOTE — Progress Notes (Signed)
° °  PRENATAL VISIT NOTE  Subjective:  Ashley Payne is a 31 y.o. [phone] at [redacted]w[redacted]d being seen today for ongoing prenatal care.  She is currently monitored for the following issues for this low-risk pregnancy and has History of recurrent miscarriages; Language barrier; Supervision of high risk pregnancy, antepartum; and History of cesarean section on their problem list.  Patient reports heartburn and episode of dizziness .  Contractions: Not present. Vag. Bleeding: None.  Movement: Present. Denies leaking of fluid.   The following portions of the patient's history were reviewed and updated as appropriate: allergies, current medications, past family history, past medical history, past social history, past surgical history and problem list.   Objective:   Vitals:   06/27/21 1457  BP: 110/62  Pulse: 89  Weight: 173 lb (78.5 kg)    Fetal Status: Fetal Heart Rate (bpm): 145   Movement: Present     General:  Alert, oriented and cooperative. Patient is in no acute distress.  Skin: Skin is warm and dry. No rash noted.   Cardiovascular: Normal heart rate noted  Respiratory: Normal respiratory effort, no problems with respiration noted  Abdomen: Soft, gravid, appropriate for gestational age.  Pain/Pressure: Absent     Pelvic: Cervical exam deferred        Extremities: Normal range of motion.  Edema: None  Mental Status: Normal mood and affect. Normal behavior. Normal judgment and thought content.   Assessment and Plan:  Pregnancy: G4P1021 at [redacted]w[redacted]d 1. [redacted] weeks gestation of pregnancy - AFP, Serum, Open Spina Bifida  2. Supervision of high risk pregnancy, antepartum FHT and FH normal Pepcid for heartburn Discussed management of orthostasis - AFP, Serum, Open Spina Bifida  3. History of cesarean section TOLAC signed - a little nervous about this option.  4. Language barrier Interpreter used  Preterm labor symptoms and general obstetric precautions including but not limited to  vaginal bleeding, contractions, leaking of fluid and fetal movement were reviewed in detail with the patient. Please refer to After Visit Summary for other counseling recommendations.   No follow-ups on file.  Future Appointments  Date Time Provider Department Center  07/11/2021  2:30 PM [facility] NURSE [facility]  07/11/2021  2:45 PM [facility] US4 [facility]  07/25/2021  2:50 PM Levie Heritage, DO [facility] None  08/22/2021  2:50 PM Levie Heritage, DO [facility] None    Levie Heritage, DO

## 2021-06-29 LAB — AFP, SERUM, OPEN SPINA BIFIDA
AFP MoM: 0.82
AFP Value: 30.9 ng/mL
Gest. Age on Collection Date: 17.3 weeks
Maternal Age At EDD: 31.2 yr
OSBR Risk 1 IN: 10000
Test Results:: NEGATIVE
Weight: 173 [lb_av]

## 2021-07-08 ENCOUNTER — Other Ambulatory Visit: Payer: Self-pay | Admitting: Family Medicine

## 2021-07-08 DIAGNOSIS — O0990 Supervision of high risk pregnancy, unspecified, unspecified trimester: Secondary | ICD-10-CM

## 2021-07-08 MED ORDER — ONDANSETRON HCL 4 MG PO TABS: 4.0000 mg | ORAL_TABLET | Freq: Three times a day (TID) | ORAL | 0 refills | Status: DC | PRN

## 2021-07-11 ENCOUNTER — Other Ambulatory Visit: Payer: Self-pay | Admitting: *Deleted

## 2021-07-11 ENCOUNTER — Other Ambulatory Visit: Payer: Self-pay

## 2021-07-11 ENCOUNTER — Ambulatory Visit: Payer: Medicaid Other | Attending: Advanced Practice Midwife

## 2021-07-11 ENCOUNTER — Ambulatory Visit: Payer: Medicaid Other | Admitting: *Deleted

## 2021-07-11 VITALS — BP 111/57 | HR 84

## 2021-07-11 DIAGNOSIS — O0990 Supervision of high risk pregnancy, unspecified, unspecified trimester: Secondary | ICD-10-CM | POA: Diagnosis not present

## 2021-07-11 DIAGNOSIS — O34219 Maternal care for unspecified type scar from previous cesarean delivery: Secondary | ICD-10-CM

## 2021-07-11 DIAGNOSIS — Z362 Encounter for other antenatal screening follow-up: Secondary | ICD-10-CM

## 2021-07-23 ENCOUNTER — Encounter: Payer: Medicaid Other | Admitting: Advanced Practice Midwife

## 2021-07-25 ENCOUNTER — Other Ambulatory Visit: Payer: Self-pay

## 2021-07-25 ENCOUNTER — Other Ambulatory Visit: Payer: Self-pay | Admitting: Family Medicine

## 2021-07-25 ENCOUNTER — Ambulatory Visit (INDEPENDENT_AMBULATORY_CARE_PROVIDER_SITE_OTHER): Payer: Medicaid Other | Admitting: Family Medicine

## 2021-07-25 VITALS — BP 120/72 | HR 105 | Wt 179.0 lb

## 2021-07-25 DIAGNOSIS — Z603 Acculturation difficulty: Secondary | ICD-10-CM

## 2021-07-25 DIAGNOSIS — O0990 Supervision of high risk pregnancy, unspecified, unspecified trimester: Secondary | ICD-10-CM

## 2021-07-25 DIAGNOSIS — N96 Recurrent pregnancy loss: Secondary | ICD-10-CM

## 2021-07-25 DIAGNOSIS — Z789 Other specified health status: Secondary | ICD-10-CM

## 2021-07-25 DIAGNOSIS — Z98891 History of uterine scar from previous surgery: Secondary | ICD-10-CM

## 2021-07-25 NOTE — Progress Notes (Signed)
° °  PRENATAL VISIT NOTE  Subjective:  Ashley Payne is a 31 y.o. [phone] at [redacted]w[redacted]d being seen today for ongoing prenatal care.  She is currently monitored for the following issues for this high-risk pregnancy and has History of recurrent miscarriages; Language barrier; Supervision of high risk pregnancy, antepartum; and History of cesarean section on their problem list.  Patient reports no complaints.  Contractions: Not present. Vag. Bleeding: None.  Movement: Present. Denies leaking of fluid.   The following portions of the patient's history were reviewed and updated as appropriate: allergies, current medications, past family history, past medical history, past social history, past surgical history and problem list.   Objective:   Vitals:   07/25/21 1444  BP: 120/72  Pulse: (!) 105  Weight: 179 lb (81.2 kg)    Fetal Status: Fetal Heart Rate (bpm): 145   Movement: Present     General:  Alert, oriented and cooperative. Patient is in no acute distress.  Skin: Skin is warm and dry. No rash noted.   Cardiovascular: Normal heart rate noted  Respiratory: Normal respiratory effort, no problems with respiration noted  Abdomen: Soft, gravid, appropriate for gestational age.  Pain/Pressure: Present     Pelvic: Cervical exam deferred        Extremities: Normal range of motion.  Edema: None  Mental Status: Normal mood and affect. Normal behavior. Normal judgment and thought content.   Assessment and Plan:  Pregnancy: [id number] at [redacted]w[redacted]d 1. Supervision of high risk pregnancy, antepartum  2. History of cesarean section Contemplating TOLAC  3. Language barrier Interpreter used  4. History of recurrent miscarriages   Preterm labor symptoms and general obstetric precautions including but not limited to vaginal bleeding, contractions, leaking of fluid and fetal movement were reviewed in detail with the patient. Please refer to After Visit Summary for other counseling recommendations.    No follow-ups on file.  Future Appointments  Date Time Provider [place]  08/08/2021  3:00 PM [facility] NURSE [facility]  08/08/2021  3:15 PM [facility] US2 [facility]  08/22/2021  2:50 PM Truett Mainland, DO [facility] None  09/12/2021  9:15 AM Anyanwu, Sallyanne Havers, MD [facility] None  09/26/2021  2:50 PM Truett Mainland, DO [facility] None    Truett Mainland, DO

## 2021-07-26 MED ORDER — ONDANSETRON HCL 4 MG PO TABS: 4.0000 mg | ORAL_TABLET | Freq: Three times a day (TID) | ORAL | 3 refills | Status: DC | PRN

## 2021-08-08 ENCOUNTER — Ambulatory Visit: Payer: Medicaid Other | Attending: Obstetrics and Gynecology

## 2021-08-08 ENCOUNTER — Other Ambulatory Visit: Payer: Self-pay

## 2021-08-08 ENCOUNTER — Ambulatory Visit: Payer: Medicaid Other | Admitting: *Deleted

## 2021-08-08 VITALS — BP 105/59 | HR 92

## 2021-08-08 DIAGNOSIS — O0990 Supervision of high risk pregnancy, unspecified, unspecified trimester: Secondary | ICD-10-CM | POA: Diagnosis present

## 2021-08-08 DIAGNOSIS — Z362 Encounter for other antenatal screening follow-up: Secondary | ICD-10-CM

## 2021-08-08 DIAGNOSIS — Z3A23 23 weeks gestation of pregnancy: Secondary | ICD-10-CM | POA: Diagnosis not present

## 2021-08-08 DIAGNOSIS — O34219 Maternal care for unspecified type scar from previous cesarean delivery: Secondary | ICD-10-CM

## 2021-08-22 ENCOUNTER — Ambulatory Visit (INDEPENDENT_AMBULATORY_CARE_PROVIDER_SITE_OTHER): Payer: Medicaid Other | Admitting: Family Medicine

## 2021-08-22 ENCOUNTER — Other Ambulatory Visit: Payer: Self-pay

## 2021-08-22 VITALS — BP 112/63 | HR 90 | Wt 184.0 lb

## 2021-08-22 DIAGNOSIS — O0990 Supervision of high risk pregnancy, unspecified, unspecified trimester: Secondary | ICD-10-CM

## 2021-08-22 DIAGNOSIS — Z3A25 25 weeks gestation of pregnancy: Secondary | ICD-10-CM

## 2021-08-22 DIAGNOSIS — Z98891 History of uterine scar from previous surgery: Secondary | ICD-10-CM

## 2021-08-22 DIAGNOSIS — Z789 Other specified health status: Secondary | ICD-10-CM

## 2021-08-22 NOTE — Progress Notes (Signed)
? ?  PRENATAL VISIT NOTE ? ?Subjective:  ?Ashley Payne is a 31 y.o. [phone] at [redacted]w[redacted]d being seen today for ongoing prenatal care.  She is currently monitored for the following issues for this high-risk pregnancy and has History of recurrent miscarriages; Language barrier; Supervision of high risk pregnancy, antepartum; and History of cesarean section on their problem list. ? ?Patient reports no complaints.  Contractions: Not present. Vag. Bleeding: None.  Movement: Present. Denies leaking of fluid.  ? ?The following portions of the patient's history were reviewed and updated as appropriate: allergies, current medications, past family history, past medical history, past social history, past surgical history and problem list.  ? ?Objective:  ? ?Vitals:  ? 08/22/21 1459  ?BP: 112/63  ?Pulse: 90  ?Weight: 184 lb (83.5 kg)  ? ? ?Fetal Status: Fetal Heart Rate (bpm): 146 Fundal Height: 24 cm Movement: Present    ? ?General:  Alert, oriented and cooperative. Patient is in no acute distress.  ?Skin: Skin is warm and dry. No rash noted.   ?Cardiovascular: Normal heart rate noted  ?Respiratory: Normal respiratory effort, no problems with respiration noted  ?Abdomen: Soft, gravid, appropriate for gestational age.  Pain/Pressure: Absent     ?Pelvic: Cervical exam deferred        ?Extremities: Normal range of motion.  Edema: None  ?Mental Status: Normal mood and affect. Normal behavior. Normal judgment and thought content.  ? ?Assessment and Plan:  ?Pregnancy: [id number] at [redacted]w[redacted]d ?1. [redacted] weeks gestation of pregnancy ?FHT and FH normal ? ?2. Supervision of high risk pregnancy, antepartum ? ?3. History of cesarean section ?Contemplating TOLAC, but desires BTL. Discussed PP BTL in event of TOLAC. Patient may prefer RLTCS. Discussed recovery short and faster with SVD and PP BTL. Patient still deciding. ? ?4. Language barrier ?Interpreter used ? ?Preterm labor symptoms and general obstetric precautions including but not limited  to vaginal bleeding, contractions, leaking of fluid and fetal movement were reviewed in detail with the patient. ?Please refer to After Visit Summary for other counseling recommendations.  ? ?No follow-ups on file. ? ?Future Appointments  ?Date Time Provider [place]  ?09/12/2021  9:15 AM Anyanwu, Sallyanne Havers, MD [facility] None  ?09/26/2021  2:50 PM Nehemiah Settle Tanna Savoy, DO [facility] None  ?10/10/2021  2:50 PM Truett Mainland, DO [facility] None  ?10/24/2021  2:50 PM Truett Mainland, DO [facility] None  ?11/07/2021  2:50 PM Nehemiah Settle Tanna Savoy, DO [facility] None  ?11/14/2021 10:55 AM Nehemiah Settle, Tanna Savoy, DO [facility] None  ?11/21/2021  2:50 PM Truett Mainland, DO [facility] None  ?11/28/2021  2:50 PM Truett Mainland, DO [facility] None  ? ? ?Truett Mainland, DO ? ? ? ?

## 2021-08-22 NOTE — Progress Notes (Signed)
CAP interpreter [place].  ?

## 2021-09-12 ENCOUNTER — Encounter: Payer: Medicaid Other | Admitting: Obstetrics & Gynecology

## 2021-09-19 ENCOUNTER — Other Ambulatory Visit: Payer: Medicaid Other

## 2021-09-19 DIAGNOSIS — Z3A29 29 weeks gestation of pregnancy: Secondary | ICD-10-CM

## 2021-09-19 NOTE — Progress Notes (Signed)
Patient sent to lab for 28 week labs. Zenna Traister RN  

## 2021-09-20 ENCOUNTER — Encounter: Payer: Self-pay | Admitting: Family Medicine

## 2021-09-20 DIAGNOSIS — O0990 Supervision of high risk pregnancy, unspecified, unspecified trimester: Secondary | ICD-10-CM

## 2021-09-20 LAB — GLUCOSE TOLERANCE, 2 HOURS W/ 1HR
Glucose, 1 hour: 138 mg/dL (ref 70–179)
Glucose, 2 hour: 100 mg/dL (ref 70–152)
Glucose, Fasting: 81 mg/dL (ref 70–91)

## 2021-09-20 LAB — RPR: RPR Ser Ql: NONREACTIVE

## 2021-09-20 LAB — CBC
Hematocrit: 32.5 % — ABNORMAL LOW (ref 34.0–46.6)
Hemoglobin: 11 g/dL — ABNORMAL LOW (ref 11.1–15.9)
MCH: 28.9 pg (ref 26.6–33.0)
MCHC: 33.8 g/dL (ref 31.5–35.7)
MCV: 85 fL (ref 79–97)
Platelets: 268 10*3/uL (ref 150–450)
RBC: 3.81 x10E6/uL (ref 3.77–5.28)
RDW: 11.9 % (ref 11.7–15.4)
WBC: 11.1 10*3/uL — ABNORMAL HIGH (ref 3.4–10.8)

## 2021-09-20 LAB — HIV ANTIBODY (ROUTINE TESTING W REFLEX): HIV Screen 4th Generation wRfx: NONREACTIVE

## 2021-09-23 MED ORDER — DOCUSATE SODIUM 100 MG PO CAPS: 100.0000 mg | ORAL_CAPSULE | Freq: Two times a day (BID) | ORAL | 2 refills | Status: DC | PRN

## 2021-09-23 MED ORDER — ONDANSETRON HCL 4 MG PO TABS: 4.0000 mg | ORAL_TABLET | Freq: Three times a day (TID) | ORAL | 3 refills | Status: DC | PRN

## 2021-09-26 ENCOUNTER — Ambulatory Visit (INDEPENDENT_AMBULATORY_CARE_PROVIDER_SITE_OTHER): Payer: Medicaid Other | Admitting: Family Medicine

## 2021-09-26 VITALS — BP 101/56 | HR 101 | Wt 184.0 lb

## 2021-09-26 DIAGNOSIS — Z3A30 30 weeks gestation of pregnancy: Secondary | ICD-10-CM

## 2021-09-26 DIAGNOSIS — Z98891 History of uterine scar from previous surgery: Secondary | ICD-10-CM

## 2021-09-26 DIAGNOSIS — O0990 Supervision of high risk pregnancy, unspecified, unspecified trimester: Secondary | ICD-10-CM

## 2021-09-26 DIAGNOSIS — Z789 Other specified health status: Secondary | ICD-10-CM

## 2021-09-26 NOTE — Progress Notes (Signed)
CAP interpreter [place].  ?

## 2021-09-26 NOTE — Progress Notes (Signed)
? ?  PRENATAL VISIT NOTE ? ?Subjective:  ?Ashley Payne is a 31 y.o. [phone] at [redacted]w[redacted]d being seen today for ongoing prenatal care.  She is currently monitored for the following issues for this high-risk pregnancy and has History of recurrent miscarriages; Language barrier; Supervision of high risk pregnancy, antepartum; and History of cesarean section on their problem list. ? ?Patient reports  intermittent upper abdominal pain with fetal movement. Goes away fairly quickly .  Contractions: Not present. Vag. Bleeding: None.  Movement: Present. Denies leaking of fluid.  ? ?The following portions of the patient's history were reviewed and updated as appropriate: allergies, current medications, past family history, past medical history, past social history, past surgical history and problem list.  ? ?Objective:  ? ?Vitals:  ? 09/26/21 1435  ?BP: (!) 101/56  ?Pulse: (!) 101  ?Weight: 184 lb (83.5 kg)  ? ? ?Fetal Status: Fetal Heart Rate (bpm): 144   Movement: Present    ? ?General:  Alert, oriented and cooperative. Patient is in no acute distress.  ?Skin: Skin is warm and dry. No rash noted.   ?Cardiovascular: Normal heart rate noted  ?Respiratory: Normal respiratory effort, no problems with respiration noted  ?Abdomen: Soft, gravid, appropriate for gestational age.  Pain/Pressure: Present     ?Pelvic: Cervical exam deferred        ?Extremities: Normal range of motion.  Edema: None  ?Mental Status: Normal mood and affect. Normal behavior. Normal judgment and thought content.  ? ?Assessment and Plan:  ?Pregnancy: [id number] at [redacted]w[redacted]d ?1. [redacted] weeks gestation of pregnancy ? ?2. Supervision of high risk pregnancy, antepartum ?FHT and FH normal ? ?3. History of cesarean section ?Desires TOLAC. Consent signed 12/29 ? ?4. Language barrier ?Interpreter used ? ?Preterm labor symptoms and general obstetric precautions including but not limited to vaginal bleeding, contractions, leaking of fluid and fetal movement were reviewed in  detail with the patient. ?Please refer to After Visit Summary for other counseling recommendations.  ? ?No follow-ups on file. ? ?Future Appointments  ?Date Time Provider [place]  ?10/10/2021  2:50 PM Truett Mainland, DO [facility] None  ?10/24/2021  2:50 PM Truett Mainland, DO [facility] None  ?11/07/2021  2:50 PM Nehemiah Settle Tanna Savoy, DO [facility] None  ?11/14/2021 10:55 AM Nehemiah Settle, Tanna Savoy, DO [facility] None  ?11/21/2021  2:50 PM Truett Mainland, DO [facility] None  ?11/28/2021  2:50 PM Truett Mainland, DO [facility] None  ? ? ?Truett Mainland, DO ? ?

## 2021-10-10 ENCOUNTER — Ambulatory Visit (INDEPENDENT_AMBULATORY_CARE_PROVIDER_SITE_OTHER): Payer: Medicaid Other | Admitting: Family Medicine

## 2021-10-10 VITALS — BP 91/56 | HR 83 | Wt 186.0 lb

## 2021-10-10 DIAGNOSIS — Z3A32 32 weeks gestation of pregnancy: Secondary | ICD-10-CM

## 2021-10-10 DIAGNOSIS — O0990 Supervision of high risk pregnancy, unspecified, unspecified trimester: Secondary | ICD-10-CM

## 2021-10-10 DIAGNOSIS — Z98891 History of uterine scar from previous surgery: Secondary | ICD-10-CM

## 2021-10-10 DIAGNOSIS — Z789 Other specified health status: Secondary | ICD-10-CM

## 2021-10-10 NOTE — Progress Notes (Signed)
? ?  PRENATAL VISIT NOTE ? ?Subjective:  ?Ashley Payne is a 31 y.o. [phone] at [redacted]w[redacted]d being seen today for ongoing prenatal care.  She is currently monitored for the following issues for this low-risk pregnancy and has History of recurrent miscarriages; Language barrier; Supervision of high risk pregnancy, antepartum; and History of cesarean section on their problem list. ? ?Patient reports no complaints.  Contractions: Irritability. Vag. Bleeding: None.  Movement: Present. Denies leaking of fluid.  ? ?The following portions of the patient's history were reviewed and updated as appropriate: allergies, current medications, past family history, past medical history, past social history, past surgical history and problem list.  ? ?Objective:  ? ?Vitals:  ? 10/10/21 1501  ?BP: (!) 91/56  ?Pulse: 83  ?Weight: 186 lb (84.4 kg)  ? ? ?Fetal Status:     Movement: Present    ? ?General:  Alert, oriented and cooperative. Patient is in no acute distress.  ?Skin: Skin is warm and dry. No rash noted.   ?Cardiovascular: Normal heart rate noted  ?Respiratory: Normal respiratory effort, no problems with respiration noted  ?Abdomen: Soft, gravid, appropriate for gestational age.  Pain/Pressure: Present     ?Pelvic: Cervical exam deferred        ?Extremities: Normal range of motion.  Edema: None  ?Mental Status: Normal mood and affect. Normal behavior. Normal judgment and thought content.  ? ?Assessment and Plan:  ?Pregnancy: [id number] at [redacted]w[redacted]d ?1. Supervision of high risk pregnancy, antepartum ?FHT and FH normal ?BTL papers signed today ? ?2. Language barrier ?Interpreter used ? ?3. History of cesarean section ?Desires TOLAC ? ?4. [redacted] weeks gestation of pregnancy ? ? ?Preterm labor symptoms and general obstetric precautions including but not limited to vaginal bleeding, contractions, leaking of fluid and fetal movement were reviewed in detail with the patient. ?Please refer to After Visit Summary for other counseling  recommendations.  ? ?No follow-ups on file. ? ?Future Appointments  ?Date Time Provider [place]  ?10/24/2021  2:50 PM Truett Mainland, DO [facility] None  ?11/07/2021  2:50 PM Nehemiah Settle Tanna Savoy, DO [facility] None  ?11/14/2021 10:55 AM Nehemiah Settle, Tanna Savoy, DO [facility] None  ?11/21/2021  2:50 PM Truett Mainland, DO [facility] None  ?11/28/2021  2:50 PM Truett Mainland, DO [facility] None  ? ? ?Truett Mainland, DO ?

## 2021-10-15 MED ORDER — PROMETHAZINE HCL 25 MG PO TABS
25.0000 mg | ORAL_TABLET | Freq: Four times a day (QID) | ORAL | 3 refills | Status: DC | PRN
Start: 2021-10-15 — End: 2022-11-07

## 2021-10-15 NOTE — Addendum Note (Signed)
Addended by: Levie Heritage on: 10/15/2021 02:31 PM ? ? Modules accepted: Orders ? ?

## 2021-10-24 ENCOUNTER — Encounter: Payer: Self-pay | Admitting: General Practice

## 2021-10-24 ENCOUNTER — Ambulatory Visit (INDEPENDENT_AMBULATORY_CARE_PROVIDER_SITE_OTHER): Payer: Medicaid Other | Admitting: Family Medicine

## 2021-10-24 VITALS — BP 109/64 | HR 93 | Wt 190.0 lb

## 2021-10-24 DIAGNOSIS — O0990 Supervision of high risk pregnancy, unspecified, unspecified trimester: Secondary | ICD-10-CM

## 2021-10-24 DIAGNOSIS — Z98891 History of uterine scar from previous surgery: Secondary | ICD-10-CM

## 2021-10-24 DIAGNOSIS — Z603 Acculturation difficulty: Secondary | ICD-10-CM

## 2021-10-24 DIAGNOSIS — Z789 Other specified health status: Secondary | ICD-10-CM

## 2021-10-24 NOTE — Progress Notes (Signed)
[employer] language [employer] interpreters [place] [phone].

## 2021-10-24 NOTE — Progress Notes (Signed)
   PRENATAL VISIT NOTE  Subjective:  Ashley Payne is a 31 y.o. [phone] at [redacted]w[redacted]d being seen today for ongoing prenatal care.  She is currently monitored for the following issues for this high-risk pregnancy and has History of recurrent miscarriages; Language barrier; Supervision of high risk pregnancy, antepartum; and History of cesarean section on their problem list.  Patient reports no complaints.  Contractions: Irritability. Vag. Bleeding: None.  Movement: Present. Denies leaking of fluid.   The following portions of the patient's history were reviewed and updated as appropriate: allergies, current medications, past family history, past medical history, past social history, past surgical history and problem list.   Objective:   Vitals:   10/24/21 1533  BP: 109/64  Pulse: 93  Weight: 190 lb (86.2 kg)    Fetal Status: Fetal Heart Rate (bpm): 146   Movement: Present     General:  Alert, oriented and cooperative. Patient is in no acute distress.  Skin: Skin is warm and dry. No rash noted.   Cardiovascular: Normal heart rate noted  Respiratory: Normal respiratory effort, no problems with respiration noted  Abdomen: Soft, gravid, appropriate for gestational age.  Pain/Pressure: Present     Pelvic: Cervical exam deferred        Extremities: Normal range of motion.  Edema: None  Mental Status: Normal mood and affect. Normal behavior. Normal judgment and thought content.   Assessment and Plan:  Pregnancy: G4P1021 at [redacted]w[redacted]d 1. Supervision of high risk pregnancy, antepartum FHT and FH normal  2. History of cesarean section Desires TOLAC  3. Language barrier Interpreter used  Preterm labor symptoms and general obstetric precautions including but not limited to vaginal bleeding, contractions, leaking of fluid and fetal movement were reviewed in detail with the patient. Please refer to After Visit Summary for other counseling recommendations.   No follow-ups on  file.  Future Appointments  Date Time Provider [place]  11/07/2021  2:50 PM Truett Mainland, DO [facility] None  11/14/2021 10:55 AM Truett Mainland, DO [facility] None  11/21/2021  2:50 PM Truett Mainland, DO [facility] None  11/28/2021  2:50 PM Truett Mainland, DO [facility] None    Truett Mainland, DO

## 2021-11-07 ENCOUNTER — Ambulatory Visit (INDEPENDENT_AMBULATORY_CARE_PROVIDER_SITE_OTHER): Payer: Medicaid Other | Admitting: Family Medicine

## 2021-11-07 ENCOUNTER — Other Ambulatory Visit (HOSPITAL_COMMUNITY)
Admission: RE | Admit: 2021-11-07 | Discharge: 2021-11-07 | Disposition: A | Discharge status: Home / Self Care | Payer: Medicaid Other | Source: Ambulatory Visit | Attending: Family Medicine | Admitting: Family Medicine

## 2021-11-07 VITALS — BP 119/67 | HR 88 | Wt 191.0 lb

## 2021-11-07 DIAGNOSIS — Z3A36 36 weeks gestation of pregnancy: Secondary | ICD-10-CM | POA: Diagnosis present

## 2021-11-07 DIAGNOSIS — O0990 Supervision of high risk pregnancy, unspecified, unspecified trimester: Secondary | ICD-10-CM | POA: Diagnosis not present

## 2021-11-07 DIAGNOSIS — Z758 Other problems related to medical facilities and other health care: Secondary | ICD-10-CM

## 2021-11-07 DIAGNOSIS — Z98891 History of uterine scar from previous surgery: Secondary | ICD-10-CM

## 2021-11-07 DIAGNOSIS — Z789 Other specified health status: Secondary | ICD-10-CM

## 2021-11-07 NOTE — Progress Notes (Signed)
   PRENATAL VISIT NOTE  Subjective:  Ashley Payne is a 31 y.o. [phone] at [redacted]w[redacted]d being seen today for ongoing prenatal care.  She is currently monitored for the following issues for this high-risk pregnancy and has History of recurrent miscarriages; Language barrier; Supervision of high risk pregnancy, antepartum; and History of cesarean section on their problem list.  Patient reports occasional contractions.  Contractions: Irritability. Vag. Bleeding: None.  Movement: Present. Denies leaking of fluid.   The following portions of the patient's history were reviewed and updated as appropriate: allergies, current medications, past family history, past medical history, past social history, past surgical history and problem list.   Objective:   Vitals:   11/07/21 1500  BP: 119/67  Pulse: 88  Weight: 191 lb (86.6 kg)    Fetal Status: Fetal Heart Rate (bpm): 145 Fundal Height: 36 cm Movement: Present  Presentation: Vertex  General:  Alert, oriented and cooperative. Patient is in no acute distress.  Skin: Skin is warm and dry. No rash noted.   Cardiovascular: Normal heart rate noted  Respiratory: Normal respiratory effort, no problems with respiration noted  Abdomen: Soft, gravid, appropriate for gestational age.  Pain/Pressure: Present     Pelvic: Cervical exam performed in the presence of a chaperone Dilation: Fingertip Effacement (%): 50 Station: -3  Extremities: Normal range of motion.  Edema: None  Mental Status: Normal mood and affect. Normal behavior. Normal judgment and thought content.   Assessment and Plan:  Pregnancy: G4P1021 at [redacted]w[redacted]d 1. Supervision of high risk pregnancy, antepartum FHT and FH normal - Culture, beta strep (group b only) - Cervicovaginal ancillary only( [place])  2. [redacted] weeks gestation of pregnancy  - Culture, beta strep (group b only) - Cervicovaginal ancillary only( [place])  3. History of cesarean section TOLAC desired  4. Language  barrier Interpreter used   Preterm labor symptoms and general obstetric precautions including but not limited to vaginal bleeding, contractions, leaking of fluid and fetal movement were reviewed in detail with the patient. Please refer to After Visit Summary for other counseling recommendations.   No follow-ups on file.  Future Appointments  Date Time Provider [place]  11/14/2021 10:55 AM Truett Mainland, DO [facility] None  11/21/2021  2:50 PM Truett Mainland, DO [facility] None  11/28/2021  2:50 PM Truett Mainland, DO [facility] None    Truett Mainland, DO

## 2021-11-11 ENCOUNTER — Encounter: Payer: Self-pay | Admitting: Family Medicine

## 2021-11-11 DIAGNOSIS — O99820 Streptococcus B carrier state complicating pregnancy: Secondary | ICD-10-CM | POA: Insufficient documentation

## 2021-11-11 LAB — CULTURE, BETA STREP (GROUP B ONLY): Strep Gp B Culture: POSITIVE — AB

## 2021-11-11 LAB — CERVICOVAGINAL ANCILLARY ONLY
Chlamydia: NEGATIVE
Comment: NEGATIVE
Comment: NORMAL
Neisseria Gonorrhea: NEGATIVE

## 2021-11-14 ENCOUNTER — Ambulatory Visit (INDEPENDENT_AMBULATORY_CARE_PROVIDER_SITE_OTHER): Payer: Medicaid Other | Admitting: Family Medicine

## 2021-11-14 VITALS — BP 125/71 | HR 103 | Wt 190.0 lb

## 2021-11-14 DIAGNOSIS — Z789 Other specified health status: Secondary | ICD-10-CM

## 2021-11-14 DIAGNOSIS — O0990 Supervision of high risk pregnancy, unspecified, unspecified trimester: Secondary | ICD-10-CM

## 2021-11-14 DIAGNOSIS — Z758 Other problems related to medical facilities and other health care: Secondary | ICD-10-CM

## 2021-11-14 DIAGNOSIS — Z98891 History of uterine scar from previous surgery: Secondary | ICD-10-CM

## 2021-11-14 DIAGNOSIS — O99820 Streptococcus B carrier state complicating pregnancy: Secondary | ICD-10-CM

## 2021-11-14 NOTE — Progress Notes (Signed)
   PRENATAL VISIT NOTE  Subjective:  Ashley Payne is a 31 y.o. [phone] at [redacted]w[redacted]d being seen today for ongoing prenatal care.  She is currently monitored for the following issues for this high-risk pregnancy and has History of recurrent miscarriages; Language barrier; Supervision of high risk pregnancy, antepartum; History of cesarean section; and GBS (group B Streptococcus carrier), +RV culture, currently pregnant on their problem list.  Patient reports  watery discharge over the past week .  Contractions: Irritability. Vag. Bleeding: None.  Movement: Present. Denies leaking of fluid.   The following portions of the patient's history were reviewed and updated as appropriate: allergies, current medications, past family history, past medical history, past social history, past surgical history and problem list.   Objective:   Vitals:   11/14/21 1115  BP: 125/71  Pulse: (!) 103  Weight: 190 lb (86.2 kg)    Fetal Status: Fetal Heart Rate (bpm): 148 Fundal Height: 37 cm Movement: Present  Presentation: Vertex  General:  Alert, oriented and cooperative. Patient is in no acute distress.  Skin: Skin is warm and dry. No rash noted.   Cardiovascular: Normal heart rate noted  Respiratory: Normal respiratory effort, no problems with respiration noted  Abdomen: Soft, gravid, appropriate for gestational age.  Pain/Pressure: Present     Pelvic: Pelvic exam done. No pooling. Physiologic discharge present         Extremities: Normal range of motion.  Edema: None  Mental Status: Normal mood and affect. Normal behavior. Normal judgment and thought content.   Assessment and Plan:  Pregnancy: G4P1021 at [redacted]w[redacted]d 1. Supervision of high risk pregnancy, antepartum FHT and FH normal  2. GBS (group B Streptococcus carrier), +RV culture, currently pregnant Intrapartum ppx.  3. History of cesarean section TOLAC  4. Language barrier Interpreter used  Preterm labor symptoms and general obstetric  precautions including but not limited to vaginal bleeding, contractions, leaking of fluid and fetal movement were reviewed in detail with the patient. Please refer to After Visit Summary for other counseling recommendations.   No follow-ups on file.  Future Appointments  Date Time Provider Department Center  11/21/2021  2:50 PM Levie Heritage, DO [facility] None  11/28/2021  2:50 PM Levie Heritage, DO [facility] None    Levie Heritage, DO

## 2021-11-14 NOTE — Progress Notes (Signed)
Video interpreter #  used for entire visit because patient was late and in person interpreter left. Armandina Stammer RN

## 2021-11-21 ENCOUNTER — Ambulatory Visit (INDEPENDENT_AMBULATORY_CARE_PROVIDER_SITE_OTHER): Payer: Medicaid Other | Admitting: Family Medicine

## 2021-11-21 VITALS — BP 118/72 | HR 104 | Wt 194.0 lb

## 2021-11-21 DIAGNOSIS — O99820 Streptococcus B carrier state complicating pregnancy: Secondary | ICD-10-CM

## 2021-11-21 DIAGNOSIS — Z789 Other specified health status: Secondary | ICD-10-CM

## 2021-11-21 DIAGNOSIS — O0990 Supervision of high risk pregnancy, unspecified, unspecified trimester: Secondary | ICD-10-CM

## 2021-11-21 DIAGNOSIS — Z758 Other problems related to medical facilities and other health care: Secondary | ICD-10-CM

## 2021-11-21 DIAGNOSIS — Z98891 History of uterine scar from previous surgery: Secondary | ICD-10-CM

## 2021-11-21 NOTE — Progress Notes (Signed)
   PRENATAL VISIT NOTE  Subjective:  Ashley Payne is a 31 y.o. [phone] at [redacted]w[redacted]d being seen today for ongoing prenatal care.  She is currently monitored for the following issues for this high-risk pregnancy and has History of recurrent miscarriages; Language barrier; Supervision of high risk pregnancy, antepartum; History of cesarean section; and GBS (group B Streptococcus carrier), +RV culture, currently pregnant on their problem list.  Patient reports no complaints.  Contractions: Irritability. Vag. Bleeding: None.  Movement: Present. Denies leaking of fluid.   The following portions of the patient's history were reviewed and updated as appropriate: allergies, current medications, past family history, past medical history, past social history, past surgical history and problem list.   Objective:   Vitals:   11/21/21 1451  BP: 118/72  Pulse: (!) 104  Weight: 194 lb (88 kg)    Fetal Status: Fetal Heart Rate (bpm): 145   Movement: Present     General:  Alert, oriented and cooperative. Patient is in no acute distress.  Skin: Skin is warm and dry. No rash noted.   Cardiovascular: Normal heart rate noted  Respiratory: Normal respiratory effort, no problems with respiration noted  Abdomen: Soft, gravid, appropriate for gestational age.  Pain/Pressure: Present     Pelvic: Cervical exam deferred        Extremities: Normal range of motion.  Edema: None  Mental Status: Normal mood and affect. Normal behavior. Normal judgment and thought content.   Assessment and Plan:  Pregnancy: G4P1021 at [redacted]w[redacted]d 1. Supervision of high risk pregnancy, antepartum Having occasional contractions  2. History of cesarean section Desires TOLAC  3. GBS (group B Streptococcus carrier), +RV culture, currently pregnant IIntrapartum PPx  4. Language barrier Interpreter used  Term labor symptoms and general obstetric precautions including but not limited to vaginal bleeding, contractions, leaking of  fluid and fetal movement were reviewed in detail with the patient. Please refer to After Visit Summary for other counseling recommendations.   No follow-ups on file.  Future Appointments  Date Time Provider Department Center  11/28/2021  2:50 PM Levie Heritage, DO [facility] None    Levie Heritage, DO

## 2021-11-28 ENCOUNTER — Ambulatory Visit (INDEPENDENT_AMBULATORY_CARE_PROVIDER_SITE_OTHER): Payer: Medicaid Other | Admitting: Family Medicine

## 2021-11-28 VITALS — BP 115/64 | HR 98 | Wt 194.0 lb

## 2021-11-28 DIAGNOSIS — O99820 Streptococcus B carrier state complicating pregnancy: Secondary | ICD-10-CM

## 2021-11-28 DIAGNOSIS — Z603 Acculturation difficulty: Secondary | ICD-10-CM

## 2021-11-28 DIAGNOSIS — Z3A39 39 weeks gestation of pregnancy: Secondary | ICD-10-CM

## 2021-11-28 DIAGNOSIS — Z98891 History of uterine scar from previous surgery: Secondary | ICD-10-CM

## 2021-11-28 DIAGNOSIS — O0990 Supervision of high risk pregnancy, unspecified, unspecified trimester: Secondary | ICD-10-CM

## 2021-11-28 DIAGNOSIS — Z789 Other specified health status: Secondary | ICD-10-CM

## 2021-11-28 NOTE — Progress Notes (Signed)
ROB 39.[redacted] wks GA No unusual complaints UC's regular for an hour here and there

## 2021-11-28 NOTE — Progress Notes (Signed)
   PRENATAL VISIT NOTE  Subjective:  Ashley Payne is a 31 y.o. [phone] at [redacted]w[redacted]d being seen today for ongoing prenatal care.  She is currently monitored for the following issues for this high-risk pregnancy and has History of recurrent miscarriages; Language barrier; Supervision of high risk pregnancy, antepartum; History of cesarean section; and GBS (group B Streptococcus carrier), +RV culture, currently pregnant on their problem list.  Patient reports occasional contractions.  Contractions: Irregular. Vag. Bleeding: None.  Movement: Present. Denies leaking of fluid.   The following portions of the patient's history were reviewed and updated as appropriate: allergies, current medications, past family history, past medical history, past social history, past surgical history and problem list.   Objective:   Vitals:   11/28/21 1453  BP: 115/64  Pulse: 98  Weight: 194 lb (88 kg)    Fetal Status: Fetal Heart Rate (bpm): 158   Movement: Present     General:  Alert, oriented and cooperative. Patient is in no acute distress.  Skin: Skin is warm and dry. No rash noted.   Cardiovascular: Normal heart rate noted  Respiratory: Normal respiratory effort, no problems with respiration noted  Abdomen: Soft, gravid, appropriate for gestational age.  Pain/Pressure: Present     Pelvic: Cervical exam deferred        Extremities: Normal range of motion.  Edema: Trace  Mental Status: Normal mood and affect. Normal behavior. Normal judgment and thought content.   Assessment and Plan:  Pregnancy: G4P1021 at [redacted]w[redacted]d 1. [redacted] weeks gestation of pregnancy  2. Supervision of high risk pregnancy, antepartum FHT and FH normal BPP next week for >40 weeks Scheduled for induction on 7/9 - [place] MFM FETAL BPP WO NON STRESS; Future  3. GBS (group B Streptococcus carrier), +RV culture, currently pregnant Intrapartum ppx  4. History of cesarean section Desires TOLAC  5. Language barrier Interpreter  used.  Term labor symptoms and general obstetric precautions including but not limited to vaginal bleeding, contractions, leaking of fluid and fetal movement were reviewed in detail with the patient. Please refer to After Visit Summary for other counseling recommendations.   No follow-ups on file.  Future Appointments  Date Time Provider Department Center  12/04/2021  8:15 AM [facility]  12/05/2021  1:10 PM Levie Heritage, DO [facility] None  12/08/2021  6:45 AM [facility] SCHED ROOM [facility] None  01/09/2022  2:10 PM Levie Heritage, DO [facility] None    Levie Heritage, DO

## 2021-12-03 ENCOUNTER — Encounter (HOSPITAL_COMMUNITY): Payer: Self-pay | Admitting: Obstetrics & Gynecology

## 2021-12-03 ENCOUNTER — Inpatient Hospital Stay (HOSPITAL_COMMUNITY)
Admission: AD | Admit: 2021-12-03 | Discharge: 2021-12-03 | Disposition: A | Discharge status: Home / Self Care | Payer: Medicaid Other | Source: Home / Self Care | Attending: Obstetrics & Gynecology | Admitting: Obstetrics & Gynecology

## 2021-12-03 ENCOUNTER — Other Ambulatory Visit: Payer: Self-pay

## 2021-12-03 DIAGNOSIS — O479 False labor, unspecified: Secondary | ICD-10-CM

## 2021-12-03 DIAGNOSIS — Z3689 Encounter for other specified antenatal screening: Secondary | ICD-10-CM

## 2021-12-03 DIAGNOSIS — O480 Post-term pregnancy: Secondary | ICD-10-CM | POA: Insufficient documentation

## 2021-12-03 DIAGNOSIS — O34219 Maternal care for unspecified type scar from previous cesarean delivery: Secondary | ICD-10-CM | POA: Insufficient documentation

## 2021-12-03 DIAGNOSIS — Z3A40 40 weeks gestation of pregnancy: Secondary | ICD-10-CM | POA: Insufficient documentation

## 2021-12-03 DIAGNOSIS — O471 False labor at or after 37 completed weeks of gestation: Secondary | ICD-10-CM | POA: Insufficient documentation

## 2021-12-03 LAB — POCT FERN TEST: POCT Fern Test: NEGATIVE

## 2021-12-03 MED ORDER — ZOLPIDEM TARTRATE 5 MG PO TABS
5.0000 mg | ORAL_TABLET | Freq: Once | ORAL | Status: AC
  Administered 2021-12-03: 5 mg via ORAL
  Filled 2021-12-03: qty 1

## 2021-12-03 MED ORDER — ZOLPIDEM TARTRATE 5 MG PO TABS: 5.0000 mg | ORAL_TABLET | Freq: Every evening | ORAL | 0 refills | Status: DC | PRN

## 2021-12-03 NOTE — MAU Provider Note (Signed)
Event Date/Time  First Provider Initiated Contact with Patient 12/03/21 1926       S: Ms. Ashley Payne is a 31 y.o. [phone] at [redacted]w[redacted]d  who presents to MAU today complaining of leaking of fluid for the past day. She denies vaginal bleeding. She endorses contractions since 0400 today. She reports normal fetal movement.    Patient desires TOLAC.  O: BP 117/67   Pulse 92   Temp 98.7 F (37.1 C) (Oral)   Resp 20   LMP 02/25/2021   SpO2 98%  GENERAL: Well-developed, well-nourished female in no acute distress.  HEAD: Normocephalic, atraumatic.  CHEST: Normal effort of breathing, regular heart rate ABDOMEN: Soft, nontender, gravid PELVIC: Normal external female genitalia. Vagina is pink and rugated. Cervix with normal contour, no lesions. Normal discharge.  Negative pooling.   Cervical exam:  Dilation: 1.5 Effacement (%): 60 Cervical Position: Posterior Station: Ballotable Exam by:: madison shropshire rn   Fetal Monitoring: Baseline: 135 Variability: Mod Accelerations: 15 x 15 Decelerations: None Contractions: Irregular 2-5 min  Results for orders placed or performed during the hospital encounter of 12/03/21 (from the past 24 hour(s))  POCT fern test     Status: None   Collection Time: 12/03/21  7:49 PM  Result Value Ref Range   POCT Fern Test Negative = intact amniotic membranes      A: SIUP at [redacted]w[redacted]d  Intact amniotic sac (negative pooling, negative fern) Cat I tracing Cervix unchanged 2 hours after initial exam For TOLAC  P: Discharge home in stable condition with labor precautions    F/U: Patient has a prenatal appointment tomorrow IOL scheduled for 12/08/2021  Calvert Cantor, CNM   Meds ordered this encounter  Medications   zolpidem (AMBIEN) tablet 5 mg

## 2021-12-03 NOTE — MAU Note (Signed)
Ashley Payne is a 31 y.o. at [redacted]w[redacted]d here in MAU reporting: contractions started at 4 this morning and have gotten much stronger. They are every 3-10 minutes. Denies bleeding. Having some LOF for the past day, is clear.  Onset of complaint: today  Pain score: 9/10  Vitals:   12/03/21 1859  BP: 124/71  Pulse: 87  Resp: 20  Temp: 98.7 F (37.1 C)  SpO2: 98%     FHT:EFM applied in room  Lab orders placed from triage: none

## 2021-12-04 ENCOUNTER — Inpatient Hospital Stay (HOSPITAL_COMMUNITY): Payer: Medicaid Other | Admitting: Anesthesiology

## 2021-12-04 ENCOUNTER — Inpatient Hospital Stay (HOSPITAL_COMMUNITY)
Admission: AD | Admit: 2021-12-04 | Discharge: 2021-12-06 | DRG: 807 | Disposition: A | Discharge status: Home / Self Care | Payer: Medicaid Other | Attending: Obstetrics & Gynecology | Admitting: Obstetrics & Gynecology

## 2021-12-04 ENCOUNTER — Ambulatory Visit (INDEPENDENT_AMBULATORY_CARE_PROVIDER_SITE_OTHER): Payer: Medicaid Other

## 2021-12-04 ENCOUNTER — Ambulatory Visit (INDEPENDENT_AMBULATORY_CARE_PROVIDER_SITE_OTHER): Payer: Medicaid Other | Admitting: *Deleted

## 2021-12-04 ENCOUNTER — Encounter (HOSPITAL_COMMUNITY): Payer: Self-pay | Admitting: Family Medicine

## 2021-12-04 DIAGNOSIS — O34219 Maternal care for unspecified type scar from previous cesarean delivery: Secondary | ICD-10-CM | POA: Diagnosis present

## 2021-12-04 DIAGNOSIS — O99824 Streptococcus B carrier state complicating childbirth: Secondary | ICD-10-CM | POA: Diagnosis present

## 2021-12-04 DIAGNOSIS — O480 Post-term pregnancy: Principal | ICD-10-CM | POA: Diagnosis present

## 2021-12-04 DIAGNOSIS — O99820 Streptococcus B carrier state complicating pregnancy: Secondary | ICD-10-CM

## 2021-12-04 DIAGNOSIS — Z789 Other specified health status: Secondary | ICD-10-CM | POA: Diagnosis present

## 2021-12-04 DIAGNOSIS — Z98891 History of uterine scar from previous surgery: Secondary | ICD-10-CM

## 2021-12-04 DIAGNOSIS — Z603 Acculturation difficulty: Secondary | ICD-10-CM | POA: Diagnosis present

## 2021-12-04 DIAGNOSIS — O34211 Maternal care for low transverse scar from previous cesarean delivery: Secondary | ICD-10-CM | POA: Diagnosis not present

## 2021-12-04 DIAGNOSIS — Z302 Encounter for sterilization: Secondary | ICD-10-CM | POA: Diagnosis not present

## 2021-12-04 DIAGNOSIS — Z3A40 40 weeks gestation of pregnancy: Secondary | ICD-10-CM | POA: Diagnosis not present

## 2021-12-04 DIAGNOSIS — O0990 Supervision of high risk pregnancy, unspecified, unspecified trimester: Principal | ICD-10-CM

## 2021-12-04 LAB — TYPE AND SCREEN
ABO/RH(D): O POS
Antibody Screen: NEGATIVE

## 2021-12-04 LAB — CBC
HCT: 39.7 % (ref 36.0–46.0)
Hemoglobin: 13.2 g/dL (ref 12.0–15.0)
MCH: 29 pg (ref 26.0–34.0)
MCHC: 33.2 g/dL (ref 30.0–36.0)
MCV: 87.3 fL (ref 80.0–100.0)
Platelets: 282 10*3/uL (ref 150–400)
RBC: 4.55 MIL/uL (ref 3.87–5.11)
RDW: 15.8 % — ABNORMAL HIGH (ref 11.5–15.5)
WBC: 16.9 10*3/uL — ABNORMAL HIGH (ref 4.0–10.5)
nRBC: 0 % (ref 0.0–0.2)

## 2021-12-04 MED ORDER — PHENYLEPHRINE 80 MCG/ML (10ML) SYRINGE FOR IV PUSH (FOR BLOOD PRESSURE SUPPORT)
80.0000 ug | PREFILLED_SYRINGE | INTRAVENOUS | Status: DC | PRN
  Filled 2021-12-04: qty 10

## 2021-12-04 MED ORDER — LACTATED RINGERS IV SOLN: 500.0000 mL | Freq: Once | INTRAVENOUS | Status: DC

## 2021-12-04 MED ORDER — EPHEDRINE 5 MG/ML INJ: 10.0000 mg | INTRAVENOUS | Status: DC | PRN

## 2021-12-04 MED ORDER — LIDOCAINE HCL (PF) 1 % IJ SOLN
INTRAMUSCULAR | Status: DC | PRN
  Administered 2021-12-04: 8 mL via EPIDURAL

## 2021-12-04 MED ORDER — OXYTOCIN-SODIUM CHLORIDE 30-0.9 UT/500ML-% IV SOLN
1.0000 m[IU]/min | INTRAVENOUS | Status: DC
  Administered 2021-12-05: 2 m[IU]/min via INTRAVENOUS

## 2021-12-04 MED ORDER — DIPHENHYDRAMINE HCL 50 MG/ML IJ SOLN: 12.5000 mg | INTRAMUSCULAR | Status: DC | PRN

## 2021-12-04 MED ORDER — OXYCODONE-ACETAMINOPHEN 5-325 MG PO TABS: 2.0000 | ORAL_TABLET | ORAL | Status: DC | PRN

## 2021-12-04 MED ORDER — SOD CITRATE-CITRIC ACID 500-334 MG/5ML PO SOLN: 30.0000 mL | ORAL | Status: DC | PRN

## 2021-12-04 MED ORDER — ACETAMINOPHEN 325 MG PO TABS: 650.0000 mg | ORAL_TABLET | ORAL | Status: DC | PRN

## 2021-12-04 MED ORDER — OXYTOCIN BOLUS FROM INFUSION
333.0000 mL | Freq: Once | INTRAVENOUS | Status: AC
  Administered 2021-12-05: 333 mL via INTRAVENOUS

## 2021-12-04 MED ORDER — PENICILLIN G POT IN DEXTROSE 60000 UNIT/ML IV SOLN
3.0000 10*6.[IU] | INTRAVENOUS | Status: DC
  Administered 2021-12-04 (×2): 3 10*6.[IU] via INTRAVENOUS
  Filled 2021-12-04 (×2): qty 50

## 2021-12-04 MED ORDER — LACTATED RINGERS IV SOLN: INTRAVENOUS | Status: DC

## 2021-12-04 MED ORDER — OXYTOCIN-SODIUM CHLORIDE 30-0.9 UT/500ML-% IV SOLN
2.5000 [IU]/h | INTRAVENOUS | Status: DC
  Administered 2021-12-05: 2.5 [IU]/h via INTRAVENOUS
  Filled 2021-12-04: qty 500

## 2021-12-04 MED ORDER — LIDOCAINE HCL (PF) 1 % IJ SOLN: 30.0000 mL | INTRAMUSCULAR | Status: DC | PRN

## 2021-12-04 MED ORDER — FENTANYL-BUPIVACAINE-NACL 0.5-0.125-0.9 MG/250ML-% EP SOLN: 12.0000 mL/h | EPIDURAL | Status: DC | PRN

## 2021-12-04 MED ORDER — PHENYLEPHRINE 80 MCG/ML (10ML) SYRINGE FOR IV PUSH (FOR BLOOD PRESSURE SUPPORT)
80.0000 ug | PREFILLED_SYRINGE | INTRAVENOUS | Status: DC | PRN
  Administered 2021-12-04 (×2): 80 ug via INTRAVENOUS

## 2021-12-04 MED ORDER — FENTANYL-BUPIVACAINE-NACL 0.5-0.125-0.9 MG/250ML-% EP SOLN
12.0000 mL/h | EPIDURAL | Status: DC | PRN
  Administered 2021-12-04: 12 mL/h via EPIDURAL
  Filled 2021-12-04: qty 250

## 2021-12-04 MED ORDER — PENICILLIN G POTASSIUM 5000000 UNITS IJ SOLR
5.0000 10*6.[IU] | Freq: Once | INTRAMUSCULAR | Status: AC
  Administered 2021-12-04: 5 10*6.[IU] via INTRAVENOUS
  Filled 2021-12-04: qty 5

## 2021-12-04 MED ORDER — TERBUTALINE SULFATE 1 MG/ML IJ SOLN: 0.2500 mg | Freq: Once | INTRAMUSCULAR | Status: DC | PRN

## 2021-12-04 MED ORDER — ONDANSETRON HCL 4 MG/2ML IJ SOLN
4.0000 mg | Freq: Four times a day (QID) | INTRAMUSCULAR | Status: DC | PRN
  Administered 2021-12-04: 4 mg via INTRAVENOUS
  Filled 2021-12-04: qty 2

## 2021-12-04 MED ORDER — LACTATED RINGERS IV SOLN: 500.0000 mL | INTRAVENOUS | Status: DC | PRN

## 2021-12-04 MED ORDER — OXYCODONE-ACETAMINOPHEN 5-325 MG PO TABS: 1.0000 | ORAL_TABLET | ORAL | Status: DC | PRN

## 2021-12-04 NOTE — Progress Notes (Signed)
Pt informed that the ultrasound is considered a limited OB ultrasound and is not intended to be a complete ultrasound exam.  Patient also informed that the ultrasound is not being completed with the intent of assessing for fetal or placental anomalies or any pelvic abnormalities.  Explained that the purpose of today's ultrasound is to assess for presentation, BPP and amniotic fluid volume.  Patient acknowledges the purpose of the exam and the limitations of the study.    Pt is uncomfortable with UC"s.  Cx exam (3 cm) per Edd Arbour, CNM.  Pt instructed to go to [facility] for admission due to active labor.

## 2021-12-04 NOTE — H&P (Signed)
OBSTETRIC ADMISSION HISTORY AND PHYSICAL  Nohealani Adria Devon is a 31 y.o. female [phone] with IUP at [redacted]w[redacted]d by LMP presenting for SOL. She has been contracting over the last couple of weeks but they got stronger over the last few days. She reports +FMs, no LOF, no VB, no blurry vision, headaches, peripheral edema, or RUQ pain.  She plans on breast and formula feeding. She requests BTL for birth control postpartum.  She received her prenatal care at [facility].   Dating: By LMP --->  Estimated Date of Delivery: 12/02/21  Sono:   @[redacted]w[redacted]d , CWD, normal anatomy, breech presentation, posterior placental lie, 581 g, 35% EFW  Prenatal History/Complications:  Hx of CS x1 (2021) Language barrier   Past Medical History: Past Medical History:  Diagnosis Date   Medical history non-contributory     Past Surgical History: Past Surgical History:  Procedure Laterality Date   CESAREAN SECTION     NO PAST SURGERIES      Obstetrical History: OB History     Gravida  4   Para  1   Term  1   Preterm      AB  2   Living  1      SAB  2   IAB      Ectopic      Multiple      Live Births  1           Social History Social History   Socioeconomic History   Marital status: Married    Spouse name: Not on file   Number of children: Not on file   Years of education: Not on file   Highest education level: Not on file  Occupational History   Not on file  Tobacco Use   Smoking status: Never   Smokeless tobacco: Never  Vaping Use   Vaping Use: Never used  Substance and Sexual Activity   Alcohol use: No   Drug use: Never   Sexual activity: Yes  Other Topics Concern   Not on file  Social History Narrative   Not on file   Social Determinants of Health   Financial Resource Strain: Not on file  Food Insecurity: Not on file  Transportation Needs: Not on file  Physical Activity: Not on file  Stress: Not on file  Social Connections: Not on file    Family  History: Family History  Problem Relation Age of Onset   Healthy Mother    Healthy Father     Allergies: No Known Allergies  Medications Prior to Admission  Medication Sig Dispense Refill Last Dose   Prenatal Vit-Fe Fumarate-FA (PRENATAL VITAMINS PO) Take by mouth.   12/04/2021   acetaminophen (TYLENOL) 500 MG tablet Take 500 mg by mouth every 6 (six) hours as needed. (Patient not taking: Reported on 08/22/2021)      docusate sodium (COLACE) 100 MG capsule Take 1 capsule (100 mg total) by mouth 2 (two) times daily as needed. (Patient not taking: Reported on 09/26/2021) 30 capsule 2    famotidine (PEPCID) 20 MG tablet Take 1 tablet (20 mg total) by mouth 2 (two) times daily. 60 tablet 3 Unknown   ondansetron (ZOFRAN) 4 MG tablet Take 1 tablet (4 mg total) by mouth every 8 (eight) hours as needed for nausea or vomiting. (Patient not taking: Reported on 11/28/2021) 60 tablet 3    promethazine (PHENERGAN) 25 MG tablet Take 1 tablet (25 mg total) by mouth every 6 (six) hours as needed  for nausea or vomiting. 20 tablet 3    zolpidem (AMBIEN) 5 MG tablet Take 1 tablet (5 mg total) by mouth at bedtime as needed for up to 4 days for sleep. 4 tablet 0      Review of Systems  All systems reviewed and negative except as stated in HPI  Blood pressure 125/69, pulse 81, temperature 98.2 F (36.8 C), resp. rate 19, last menstrual period 02/25/2021, SpO2 99 %.  General appearance: alert, cooperative, and no distress Lungs: normal work of breathing on room air  Heart: normal rate, warm and well perfused  Abdomen: soft, non-tender, gravid  Extremities: no LE edema or calf tenderness to palpation   Presentation: Cephalic per RN  Fetal monitoring: Baseline 135 bpm, moderate variability, + accels, variable decels  Uterine activity: Every 2-5 minutes  Dilation: 3 Effacement (%): 60 Station: -2 Exam by:: weston,rn  Prenatal labs: ABO, Rh: O/Positive/-- (11/29 1109) Antibody: Negative (11/29  1109) Rubella: 13.50 (11/29 1109) RPR: Non Reactive (04/20 0819)  HBsAg: Negative (11/29 1109)  HIV: Non Reactive (04/20 0819)  GBS: Positive/-- (06/08 1508)  2 hr Glucola normal  Genetic screening - LR NIPS, Horizon negative, AFP negative  Anatomy US normal   Prenatal Transfer Tool  Maternal Diabetes: No Genetic Screening: Normal Maternal Ultrasounds/Referrals: Normal Fetal Ultrasounds or other Referrals:  None Maternal Substance Abuse:  No Significant Maternal Medications:  None Significant Maternal Lab Results: Group B Strep positive  Results for orders placed or performed during the hospital encounter of 12/03/21 (from the past 24 hour(s))  POCT fern test   Collection Time: 12/03/21  7:49 PM  Result Value Ref Range   POCT Fern Test Negative = intact amniotic membranes     Patient Active Problem List   Diagnosis Date Noted   GBS (group B Streptococcus carrier), +RV culture, currently pregnant 11/11/2021   Supervision of high risk pregnancy, antepartum 04/30/2021   History of cesarean section 04/30/2021   Language barrier 06/21/2019   History of recurrent miscarriages 05/14/2019    Assessment/Plan:  Makaiyah Schweiger is a 31 y.o. G4P1021 at [redacted]w[redacted]d here for SOL.   #Labor/TOLAC: Reviewed risks of TOLAC vs repeat cesarean section on admission. Patient agrees to proceed with TOLAC. Consent previously signed on 05/30/21. Progressing well. Will continue expectant management. Plan for AROM on next exam for augmentation as needed.  #Pain: PRN; planning for epidural when ready  #FWB: Cat 2 due to occasional variable decels with contractions. Reassuring variability and accels. Will continue to monitor closely.  #ID:  GBS positive; PCN ordered  #MOF: Breast and formula  #MOC: BTL (consent signed 10/10/21) #Circ:  No   [employer] Spanish video interpreter used for encounter (#[id number]).   Worthy Rancher, MD  12/04/2021, 12:10 PM

## 2021-12-04 NOTE — MAU Note (Signed)
Pt presents from office with contractions, was there for post dates testing. Contractions started yesterday, SVE 1.5 yesterday. Today at office 3 cm. Reports good fetal movement.

## 2021-12-04 NOTE — Progress Notes (Signed)
Labor Progress Note Ashley Payne is a 31 y.o. [phone] at [redacted]w[redacted]d who presented for SOL/TOLAC.  S: Doing well. Comfortable with epidural. No concerns. Family at bedside.   O:  BP (!) 83/43 Comment: pt unsymptomatic; fluid bolus started  Pulse 90   Temp 98.1 F (36.7 C) (Oral)   Resp 16   Ht 5\' 3"  (1.6 m)   Wt 88.9 kg   LMP 02/25/2021   SpO2 98%   BMI 34.72 kg/m   EFM: Baseline 135 bpm, moderate variability, + accels, late decels  Toco: Every 2-5 minutes   CVE: Dilation: 4.5 Effacement (%): 90 Cervical Position: Posterior Station: -2 Presentation: Vertex Exam by:: Dr. [email or web address]  A&P: 31 y.o. 38 [redacted]w[redacted]d   #Labor: Progressing well. Bulging bag noted on exam. AROM discussed and patient verbally consented. AROM performed with moderate amount of meconium stained fluid. Mom and baby tolerated this well. Will reassess in 3-4 hours. Plan to augment with Pitocin if unchanged on next exam.  #Pain: Epidural  #FWB: Cat 2 due to intermittent late decels. Low BP after epidural. IVF bolus started. Will give dose of Phenylephrine and reassess. #GBS positive; received PCN x 1   [employer] Spanish video interpreter used for encounter.   [redacted]w[redacted]d, MD 7:10 PM

## 2021-12-04 NOTE — Anesthesia Procedure Notes (Signed)
Epidural Patient location during procedure: OB Start time: 12/04/2021 5:17 PM End time: 12/04/2021 5:22 PM  Staffing Anesthesiologist: Bethena Midget, MD  Preanesthetic Checklist Completed: patient identified, IV checked, site marked, risks and benefits discussed, surgical consent, monitors and equipment checked, pre-op evaluation and timeout performed  Epidural Patient position: sitting Prep: DuraPrep and site prepped and draped Patient monitoring: continuous pulse ox and blood pressure Approach: midline Location: L3-L4 Injection technique: LOR air  Needle:  Needle type: Tuohy  Needle gauge: 17 G Needle length: 9 cm and 9 Needle insertion depth: 7 cm Catheter type: closed end flexible Catheter size: 19 Gauge Catheter at skin depth: 13 cm Test dose: negative  Assessment Events: blood not aspirated, injection not painful, no injection resistance, no paresthesia and negative IV test

## 2021-12-04 NOTE — Anesthesia Preprocedure Evaluation (Signed)
Anesthesia Evaluation  Patient identified by MRN, date of birth, ID band Patient awake    Reviewed: Allergy & Precautions, H&P , NPO status , Patient's Chart, lab work & pertinent test results, reviewed documented beta blocker date and time   Airway Mallampati: II  TM Distance: >3 FB Neck ROM: full    Dental no notable dental hx. (+) Teeth Intact, Dental Advisory Given   Pulmonary neg pulmonary ROS,    Pulmonary exam normal breath sounds clear to auscultation       Cardiovascular negative cardio ROS Normal cardiovascular exam Rhythm:regular Rate:Normal     Neuro/Psych negative neurological ROS  negative psych ROS   GI/Hepatic negative GI ROS, Neg liver ROS,   Endo/Other  negative endocrine ROS  Renal/GU negative Renal ROS  negative genitourinary   Musculoskeletal   Abdominal   Peds  Hematology negative hematology ROS (+)   Anesthesia Other Findings   Reproductive/Obstetrics (+) Pregnancy                             Anesthesia Physical Anesthesia Plan  ASA: 2  Anesthesia Plan: Epidural   Post-op Pain Management:    Induction:   PONV Risk Score and Plan: 2 and Treatment may vary due to age or medical condition  Airway Management Planned: Natural Airway  Additional Equipment: None  Intra-op Plan:   Post-operative Plan:   Informed Consent: I have reviewed the patients History and Physical, chart, labs and discussed the procedure including the risks, benefits and alternatives for the proposed anesthesia with the patient or authorized representative who has indicated his/her understanding and acceptance.     Dental Advisory Given  Plan Discussed with: Anesthesiologist  Anesthesia Plan Comments: (Labs checked- platelets confirmed with RN in room. Fetal heart tracing, per RN, reported to be stable enough for sitting procedure. Discussed epidural, and patient consents to the  procedure:  included risk of possible headache,backache, failed block, allergic reaction, and nerve injury. This patient was asked if she had any questions or concerns before the procedure started.)        Anesthesia Quick Evaluation  

## 2021-12-05 ENCOUNTER — Encounter: Payer: Medicaid Other | Admitting: Family Medicine

## 2021-12-05 ENCOUNTER — Encounter (HOSPITAL_COMMUNITY)
Admission: AD | Disposition: A | Discharge status: Home / Self Care | Payer: Self-pay | Source: Home / Self Care | Attending: Obstetrics & Gynecology

## 2021-12-05 ENCOUNTER — Encounter (HOSPITAL_COMMUNITY): Payer: Self-pay | Admitting: Family Medicine

## 2021-12-05 ENCOUNTER — Other Ambulatory Visit: Payer: Self-pay | Admitting: Advanced Practice Midwife

## 2021-12-05 ENCOUNTER — Inpatient Hospital Stay (HOSPITAL_COMMUNITY): Payer: Medicaid Other | Admitting: Anesthesiology

## 2021-12-05 DIAGNOSIS — O480 Post-term pregnancy: Secondary | ICD-10-CM

## 2021-12-05 DIAGNOSIS — Z302 Encounter for sterilization: Secondary | ICD-10-CM

## 2021-12-05 DIAGNOSIS — O99820 Streptococcus B carrier state complicating pregnancy: Secondary | ICD-10-CM

## 2021-12-05 DIAGNOSIS — Z3A40 40 weeks gestation of pregnancy: Secondary | ICD-10-CM

## 2021-12-05 DIAGNOSIS — O34211 Maternal care for low transverse scar from previous cesarean delivery: Secondary | ICD-10-CM

## 2021-12-05 HISTORY — PX: TUBAL LIGATION: SHX77

## 2021-12-05 LAB — RPR: RPR Ser Ql: NONREACTIVE

## 2021-12-05 SURGERY — LIGATION, FALLOPIAN TUBE, POSTPARTUM
Anesthesia: Epidural | Laterality: Bilateral

## 2021-12-05 MED ORDER — LIDOCAINE-EPINEPHRINE (PF) 2 %-1:200000 IJ SOLN
INTRAMUSCULAR | Status: AC
  Filled 2021-12-05: qty 20

## 2021-12-05 MED ORDER — COCONUT OIL OIL: 1.0000 | TOPICAL_OIL | Status: DC | PRN

## 2021-12-05 MED ORDER — SIMETHICONE 80 MG PO CHEW: 80.0000 mg | CHEWABLE_TABLET | ORAL | Status: DC | PRN

## 2021-12-05 MED ORDER — IBUPROFEN 600 MG PO TABS
600.0000 mg | ORAL_TABLET | Freq: Four times a day (QID) | ORAL | Status: DC
  Administered 2021-12-05 – 2021-12-06 (×6): 600 mg via ORAL
  Filled 2021-12-05 (×6): qty 1

## 2021-12-05 MED ORDER — METOCLOPRAMIDE HCL 10 MG PO TABS
10.0000 mg | ORAL_TABLET | Freq: Once | ORAL | Status: AC
  Administered 2021-12-05: 10 mg via ORAL
  Filled 2021-12-05: qty 1

## 2021-12-05 MED ORDER — BUPIVACAINE HCL (PF) 0.25 % IJ SOLN
INTRAMUSCULAR | Status: AC
  Filled 2021-12-05: qty 30

## 2021-12-05 MED ORDER — LIDOCAINE-EPINEPHRINE (PF) 2 %-1:200000 IJ SOLN
INTRAMUSCULAR | Status: DC | PRN
  Administered 2021-12-05 (×4): 5 mL via INTRADERMAL

## 2021-12-05 MED ORDER — PROPOFOL 10 MG/ML IV BOLUS
INTRAVENOUS | Status: AC
  Filled 2021-12-05: qty 20

## 2021-12-05 MED ORDER — FENTANYL CITRATE (PF) 100 MCG/2ML IJ SOLN
INTRAMUSCULAR | Status: AC
  Filled 2021-12-05: qty 2

## 2021-12-05 MED ORDER — TETANUS-DIPHTH-ACELL PERTUSSIS 5-2.5-18.5 LF-MCG/0.5 IM SUSY: 0.5000 mL | PREFILLED_SYRINGE | Freq: Once | INTRAMUSCULAR | Status: DC

## 2021-12-05 MED ORDER — POLYETHYLENE GLYCOL 3350 17 G PO PACK
17.0000 g | PACK | Freq: Every day | ORAL | Status: DC | PRN
Start: 2021-12-05 — End: 2021-12-07

## 2021-12-05 MED ORDER — FAMOTIDINE 20 MG PO TABS
40.0000 mg | ORAL_TABLET | Freq: Once | ORAL | Status: AC
  Administered 2021-12-05: 40 mg via ORAL
  Filled 2021-12-05: qty 2

## 2021-12-05 MED ORDER — ONDANSETRON HCL 4 MG PO TABS: 4.0000 mg | ORAL_TABLET | ORAL | Status: DC | PRN

## 2021-12-05 MED ORDER — ACETAMINOPHEN 325 MG PO TABS
650.0000 mg | ORAL_TABLET | ORAL | Status: DC | PRN
  Administered 2021-12-05 – 2021-12-06 (×2): 650 mg via ORAL
  Filled 2021-12-05 (×2): qty 2

## 2021-12-05 MED ORDER — SENNOSIDES-DOCUSATE SODIUM 8.6-50 MG PO TABS
2.0000 | ORAL_TABLET | ORAL | Status: DC
  Administered 2021-12-06: 2 via ORAL
  Filled 2021-12-05 (×2): qty 2

## 2021-12-05 MED ORDER — FENTANYL CITRATE (PF) 100 MCG/2ML IJ SOLN
INTRAMUSCULAR | Status: DC | PRN
  Administered 2021-12-05: 100 ug via EPIDURAL

## 2021-12-05 MED ORDER — ONDANSETRON HCL 4 MG/2ML IJ SOLN: 4.0000 mg | INTRAMUSCULAR | Status: DC | PRN

## 2021-12-05 MED ORDER — MIDAZOLAM HCL 2 MG/2ML IJ SOLN
INTRAMUSCULAR | Status: AC
  Filled 2021-12-05: qty 2

## 2021-12-05 MED ORDER — DEXMEDETOMIDINE HCL IN NACL 80 MCG/20ML IV SOLN
INTRAVENOUS | Status: AC
  Filled 2021-12-05: qty 20

## 2021-12-05 MED ORDER — ONDANSETRON HCL 4 MG/2ML IJ SOLN
INTRAMUSCULAR | Status: DC | PRN
  Administered 2021-12-05: 4 mg via INTRAVENOUS

## 2021-12-05 MED ORDER — SODIUM CHLORIDE 0.9% FLUSH: 3.0000 mL | INTRAVENOUS | Status: DC | PRN

## 2021-12-05 MED ORDER — WITCH HAZEL-GLYCERIN EX PADS: 1.0000 | MEDICATED_PAD | CUTANEOUS | Status: DC | PRN

## 2021-12-05 MED ORDER — DEXMEDETOMIDINE HCL IN NACL 200 MCG/50ML IV SOLN
INTRAVENOUS | Status: DC | PRN
  Administered 2021-12-05: 8 ug via INTRAVENOUS
  Administered 2021-12-05: 12 ug via INTRAVENOUS

## 2021-12-05 MED ORDER — PRENATAL MULTIVITAMIN CH
1.0000 | ORAL_TABLET | Freq: Every day | ORAL | Status: DC
  Administered 2021-12-06: 1 via ORAL
  Filled 2021-12-05: qty 1

## 2021-12-05 MED ORDER — MEASLES, MUMPS & RUBELLA VAC IJ SOLR: 0.5000 mL | Freq: Once | INTRAMUSCULAR | Status: DC

## 2021-12-05 MED ORDER — ONDANSETRON HCL 4 MG/2ML IJ SOLN
INTRAMUSCULAR | Status: AC
  Filled 2021-12-05: qty 2

## 2021-12-05 MED ORDER — BENZOCAINE-MENTHOL 20-0.5 % EX AERO
1.0000 | INHALATION_SPRAY | CUTANEOUS | Status: DC | PRN
  Administered 2021-12-05: 1 via TOPICAL
  Filled 2021-12-05: qty 56

## 2021-12-05 MED ORDER — LACTATED RINGERS IV SOLN: INTRAVENOUS | Status: DC

## 2021-12-05 MED ORDER — DIBUCAINE (PERIANAL) 1 % EX OINT: 1.0000 | TOPICAL_OINTMENT | CUTANEOUS | Status: DC | PRN

## 2021-12-05 MED ORDER — DIPHENHYDRAMINE HCL 25 MG PO CAPS: 25.0000 mg | ORAL_CAPSULE | Freq: Four times a day (QID) | ORAL | Status: DC | PRN

## 2021-12-05 MED ORDER — SODIUM CHLORIDE 0.9% FLUSH: 3.0000 mL | Freq: Two times a day (BID) | INTRAVENOUS | Status: DC

## 2021-12-05 MED ORDER — ZOLPIDEM TARTRATE 5 MG PO TABS: 5.0000 mg | ORAL_TABLET | Freq: Every evening | ORAL | Status: DC | PRN

## 2021-12-05 MED ORDER — SODIUM CHLORIDE 0.9 % IV SOLN: INTRAVENOUS | Status: DC | PRN

## 2021-12-05 SURGICAL SUPPLY — 31 items
ADH SKN CLS APL DERMABOND .7 (GAUZE/BANDAGES/DRESSINGS) ×1
CLOTH BEACON ORANGE TIMEOUT ST (SAFETY) ×2 IMPLANT
DERMABOND ADVANCED (GAUZE/BANDAGES/DRESSINGS) ×1
DERMABOND ADVANCED .7 DNX12 (GAUZE/BANDAGES/DRESSINGS) ×1 IMPLANT
DRSG OPSITE POSTOP 3X4 (GAUZE/BANDAGES/DRESSINGS) ×2 IMPLANT
ELECT REM PT RETURN 9FT ADLT (ELECTROSURGICAL) ×2
ELECTRODE REM PT RTRN 9FT ADLT (ELECTROSURGICAL) ×1 IMPLANT
GLOVE BIOGEL PI IND STRL 6.5 (GLOVE) ×1 IMPLANT
GLOVE BIOGEL PI IND STRL 7.0 (GLOVE) ×1 IMPLANT
GLOVE BIOGEL PI INDICATOR 6.5 (GLOVE) ×1
GLOVE BIOGEL PI INDICATOR 7.0 (GLOVE) ×1
GLOVE ECLIPSE 6.5 STRL STRAW (GLOVE) ×2 IMPLANT
GOWN STRL REUS W/TWL LRG LVL3 (GOWN DISPOSABLE) ×4 IMPLANT
NEEDLE HYPO 22GX1.5 SAFETY (NEEDLE) ×2 IMPLANT
NS IRRIG 1000ML POUR BTL (IV SOLUTION) ×2 IMPLANT
PACK ABDOMINAL MINOR (CUSTOM PROCEDURE TRAY) ×2 IMPLANT
PENCIL BUTTON HOLSTER BLD 10FT (ELECTRODE) ×2 IMPLANT
PROTECTOR NERVE ULNAR (MISCELLANEOUS) ×2 IMPLANT
SPONGE LAP 4X18 RFD (DISPOSABLE) IMPLANT
SUT PLAIN 0 NONE (SUTURE) ×2 IMPLANT
SUT VIC AB 0 CT1 27 (SUTURE) ×2
SUT VIC AB 0 CT1 27XBRD ANBCTR (SUTURE) ×1 IMPLANT
SUT VIC AB 3-0 SH 27 (SUTURE) ×2
SUT VIC AB 3-0 SH 27X BRD (SUTURE) IMPLANT
SUT VICRYL 4-0 PS2 18IN ABS (SUTURE) ×2 IMPLANT
SYR CONTROL 10ML LL (SYRINGE) ×2 IMPLANT
TOWEL OR 17X24 6PK STRL BLUE (TOWEL DISPOSABLE) ×4 IMPLANT
TRAY FOLEY CATH SILVER 14FR (SET/KITS/TRAYS/PACK) ×2 IMPLANT
TUBING NON-CON 1/4 X 20 CONN (TUBING) ×2 IMPLANT
WATER STERILE IRR 1000ML POUR (IV SOLUTION) ×2 IMPLANT
YANKAUER SUCT BULB TIP NO VENT (SUCTIONS) ×2 IMPLANT

## 2021-12-05 NOTE — OR Nursing (Signed)
Pt changed mind on table to have tubal. Did not want to go under general anesthesia at this time.

## 2021-12-05 NOTE — Lactation Note (Signed)
This note was copied from a baby's chart. Lactation Consultation Note Used Spanish interpreter Gillis Santa [phone] for consult. Mom plans to BF for 6 months then add formula as well. LC assisted baby to the breast. Baby latched well to mom's everted nipples. Mom denies painful latch.  Mom asked several questions, LC answered them. Mom is getting sleepy during the BF. Left for bonding time w/baby. Family at bedside.  Patient Name: Ashley Payne [id number] Date: 12/05/2021 Reason for consult: L&D Initial assessment;Term Age:31 hours  Maternal Data Has patient been taught Hand Expression?: Yes Does the patient have breastfeeding experience prior to this delivery?: Yes How long did the patient breastfeed?: 3 months  Feeding    LATCH Score Latch: Grasps breast easily, tongue down, lips flanged, rhythmical sucking.  Audible Swallowing: None  Type of Nipple: Everted at rest and after stimulation  Comfort (Breast/Nipple): Soft / non-tender  Hold (Positioning): Assistance needed to correctly position infant at breast and maintain latch.  LATCH Score: 7   Lactation Tools Discussed/Used    Interventions Interventions: Adjust position;Assisted with latch;Support pillows;Skin to skin;Breast massage;Breast compression;Hand express  Discharge    Consult Status Consult Status: Follow-up from L&D Date: 12/05/21 Follow-up type: In-patient    Charyl Dancer 12/05/2021, 4:37 AM

## 2021-12-05 NOTE — Discharge Summary (Signed)
Postpartum Discharge Summary  Date of Service updated     Patient Name: Ashley Payne DOB: Sep 05, 1990 MRN: [id number]  Date of admission: 12/04/2021 Delivery date:12/05/2021  Delivering provider: Patriciaann Clan  Date of discharge: 12/06/2021  Admitting diagnosis: Encounter for induction of labor [Z34.90] Intrauterine pregnancy: [redacted]w[redacted]d    Secondary diagnosis:  Principal Problem:   Normal labor Active Problems:   Language barrier   Supervision of high risk pregnancy, antepartum   History of cesarean section   GBS (group B Streptococcus carrier), +RV culture, currently pregnant  Additional problems: None   Discharge diagnosis: Term Pregnancy Delivered and VBAC                                              Post partum procedures: originally was going to get PPBTL but then anesthesia did not work so she was offered general and she did not want general Augmentation: AROM Complications: None  Hospital course: Onset of Labor With Vaginal Delivery      31y.o. yo [id number] admitted in Active Labor on 12/04/2021. Patient had an uncomplicated labor course as follows:  Membrane Rupture Time/Date: 6:30 PM ,12/04/2021   Delivery Method:VBAC, Spontaneous  Episiotomy: None  Lacerations:  Periurethral;Labial  Patient had an uncomplicated postpartum course.  She is ambulating, tolerating a regular diet, passing flatus, and urinating well. Patient is discharged home in stable condition on 12/06/21.  Newborn Data: Birth date:12/05/2021  Birth time:3:27 AM  Gender:Female  Living status:Living  Apgars:9 ,9  Weight:3690 g   Magnesium Sulfate received: No BMZ received: No Rhophylac:No MMR:No T-DaP:Given prenatally Flu: No Transfusion:No  Physical exam  Vitals:   12/05/21 1412 12/05/21 1548 12/05/21 2005 12/06/21 0600  BP: 117/70 110/73 112/75 115/79  Pulse: 80 88 81 72  Resp: 16 16 18 18   Temp: 98.2 F (36.8 C) 98.2 F (36.8 C) 97.9 F (36.6 C) 97.8 F (36.6 C)   TempSrc: Oral Oral Oral   SpO2: 99% 100% 100% 100%  Weight:      Height:       General: alert, cooperative, and no distress Lochia: appropriate Uterine Fundus: firm Incision: Healing well with no significant drainage DVT Evaluation: No evidence of DVT seen on physical exam. Labs: Lab Results  Component Value Date   WBC 16.9 (H) 12/04/2021   HGB 13.2 12/04/2021   HCT 39.7 12/04/2021   MCV 87.3 12/04/2021   PLT 282 12/04/2021      Latest Ref Rng & Units 04/16/2021    7:15 PM  CMP  Glucose 70 - 99 mg/dL 132   BUN 6 - 20 mg/dL 6   Creatinine 0.44 - 1.00 mg/dL 0.66   Sodium 135 - 145 mmol/L 134   Potassium 3.5 - 5.1 mmol/L 3.5   Chloride 98 - 111 mmol/L 102   CO2 22 - 32 mmol/L 25   Calcium 8.9 - 10.3 mg/dL 9.5   Total Protein 6.5 - 8.1 g/dL 7.3   Total Bilirubin 0.3 - 1.2 mg/dL 0.8   Alkaline Phos 38 - 126 U/L 59   AST 15 - 41 U/L 16   ALT 0 - 44 U/L 12    Edinburgh Score:    12/05/2021    7:32 AM  Edinburgh Postnatal Depression Scale Screening Tool  I have been able to laugh and see the funny  side of things. 0  I have looked forward with enjoyment to things. 0  I have blamed myself unnecessarily when things went wrong. 0  I have been anxious or worried for no good reason. 0  I have felt scared or panicky for no good reason. 0  Things have been getting on top of me. 0  I have been so unhappy that I have had difficulty sleeping. 0  I have felt sad or miserable. 0  I have been so unhappy that I have been crying. 0  The thought of harming myself has occurred to me. 0  Edinburgh Postnatal Depression Scale Total 0     After visit meds:  Allergies as of 12/06/2021   No Known Allergies      Medication List     TAKE these medications    acetaminophen 500 MG tablet Commonly known as: TYLENOL Take 500 mg by mouth every 6 (six) hours as needed.   docusate sodium 100 MG capsule Commonly known as: COLACE Take 1 capsule (100 mg total) by mouth 2 (two) times daily  as needed.   famotidine 20 MG tablet Commonly known as: PEPCID Take 1 tablet (20 mg total) by mouth 2 (two) times daily.   ondansetron 4 MG tablet Commonly known as: Zofran Take 1 tablet (4 mg total) by mouth every 8 (eight) hours as needed for nausea or vomiting.   PRENATAL VITAMINS PO Take by mouth.   promethazine 25 MG tablet Commonly known as: PHENERGAN Take 1 tablet (25 mg total) by mouth every 6 (six) hours as needed for nausea or vomiting.   zolpidem 5 MG tablet Commonly known as: AMBIEN Take 1 tablet (5 mg total) by mouth at bedtime as needed for up to 4 days for sleep.         Discharge home in stable condition Infant Feeding: Bottle and Breast Infant Disposition:home with mother-may be inpatient one night and mom will be "baby patient" Discharge instruction: per After Visit Summary and Postpartum booklet. Activity: Advance as tolerated. Pelvic rest for 6 weeks.  Diet: routine diet Future Appointments: Future Appointments  Date Time Provider [place]  01/09/2022  2:10 PM Truett Mainland, DO [facility] None   Follow up Visit:  Message sent to [facility] by Dr Higinio Plan:  Please schedule this patient for a In person postpartum visit in 6 weeks with the following provider: Any provider. Additional Postpartum F/U: None   Low risk pregnancy complicated by:  Previous CS Delivery mode:  VBAC, Spontaneous  Anticipated Birth Control:  BTL done PP-pending   12/06/2021 Starr Lake, CNM

## 2021-12-05 NOTE — Progress Notes (Signed)
Labor Progress Note   Checked patient around midnight and was complete/+1 station. Felt like previous R OT had transitioned to OA. She started pushing shortly after this time, so now pushing for about 1:45 hours. Initially had some challenge with pushing in correct location, but recently appears to be pushing adequately for the most part. Head +3 with pushing, 2+ in between-- however has been this way for the 45 minutes. ?if positioning is actually more OP but can no longer easily feel suture lines w/ caput.   Will continue to try different positions as needed with coaching. FHT reassuring- moderate var w/ mild variables with some contractions.   Allayne Stack, DO

## 2021-12-05 NOTE — Anesthesia Preprocedure Evaluation (Signed)
Anesthesia Evaluation  Patient identified by MRN, date of birth, ID band Patient awake    Reviewed: Allergy & Precautions, H&P , NPO status , Patient's Chart, lab work & pertinent test results, reviewed documented beta blocker date and time   Airway Mallampati: II  TM Distance: >3 FB Neck ROM: full    Dental no notable dental hx. (+) Teeth Intact, Dental Advisory Given   Pulmonary neg pulmonary ROS,    Pulmonary exam normal breath sounds clear to auscultation       Cardiovascular negative cardio ROS Normal cardiovascular exam Rhythm:regular Rate:Normal     Neuro/Psych negative neurological ROS  negative psych ROS   GI/Hepatic negative GI ROS, Neg liver ROS,   Endo/Other  negative endocrine ROS  Renal/GU negative Renal ROS  negative genitourinary   Musculoskeletal   Abdominal   Peds  Hematology negative hematology ROS (+) Lab Results      Component                Value               Date                      WBC                      16.9 (H)            12/04/2021                HGB                      13.2                12/04/2021                HCT                      39.7                12/04/2021                MCV                      87.3                12/04/2021                PLT                      282                 12/04/2021              Anesthesia Other Findings   Reproductive/Obstetrics                             Anesthesia Physical  Anesthesia Plan  ASA: 2  Anesthesia Plan: Epidural   Post-op Pain Management: Regional block*, Minimal or no pain anticipated and Ofirmev IV (intra-op)*   Induction:   PONV Risk Score and Plan: 2 and Treatment may vary due to age or medical condition  Airway Management Planned: Natural Airway and Nasal Cannula  Additional Equipment: None  Intra-op Plan:   Post-operative Plan:   Informed Consent: I have reviewed the patients  History and Physical, chart, labs and discussed the procedure including  the risks, benefits and alternatives for the proposed anesthesia with the patient or authorized representative who has indicated his/her understanding and acceptance.     Dental Advisory Given  Plan Discussed with: Anesthesiologist  Anesthesia Plan Comments:         Anesthesia Quick Evaluation

## 2021-12-05 NOTE — Progress Notes (Signed)
Patient in OR and epidural dosed up to surgical level. Prepped and draped in sterile fashion. Alice clamp used to test patient's analgesia and initially adequate. Alice clamps then placed at site of umbilical fold and patient reported significant pain. Clamps removed. Tested again shortly after with same result. Anesthesia team at bedside offered general anesthesia in order to proceed with BTL as planned with adequate pain control. Patient declined and stated that she would not like to proceed with BTL anymore. Discussed that interval tubal in 6 weeks would also be with general anesthesia. Patient voiced understanding and reiterated that she did not want a tubal ligation at this time. Procedure subsequently aborted.   [employer] Spanish video interpreter used for encounter.   Evalina Field, MD

## 2021-12-05 NOTE — Progress Notes (Signed)
Spanish interpreter present for review. Patient desires permanent sterilization.  Other reversible forms of contraception were discussed with patient; she declines all other modalities. Risks of procedure discussed with patient including but not limited to: risk of regret, permanence of method, bleeding, infection, injury to surrounding organs and need for additional procedures.  Failure risk of about 1% with increased risk of ectopic gestation if pregnancy occurs was also discussed with patient.  Patient verbalized understanding of these risks and wants to proceed with sterilization.  Written informed consent obtained.  To OR when ready.   Myna Hidalgo, DO Attending Obstetrician & Gynecologist, [facility] for [employer], [facility]

## 2021-12-05 NOTE — Anesthesia Postprocedure Evaluation (Signed)
Anesthesia Post Note  Patient: Ashley Payne  Procedure(s) Performed: AN AD HOC LABOR EPIDURAL     Patient location during evaluation: Mother Baby Anesthesia Type: Epidural Level of consciousness: awake Pain management: satisfactory to patient Vital Signs Assessment: post-procedure vital signs reviewed and stable Respiratory status: spontaneous breathing Cardiovascular status: stable Anesthetic complications: no   No notable events documented.  Last Vitals:  Vitals:   12/05/21 1412 12/05/21 1548  BP: 117/70   Pulse: 80 88  Resp: 16 16  Temp: 36.8 C   SpO2: 99% 100%    Last Pain:  Vitals:   12/05/21 1549  TempSrc:   PainSc: 6    Pain Goal:                   [employer]

## 2021-12-05 NOTE — Lactation Note (Signed)
This note was copied from a baby's chart. Lactation Consultation Note  Patient Name: Ashley Payne [id number] Date: 12/05/2021 Reason for consult: Initial assessment;Term Age:31 hours   In house interpreter with physician in another area of hospital, Ipad interpreter used. Mom nursing baby when [facility] entered.  Baby was sucking rhythmically,  swaddled in blankets.  Pillows provided for support.  Mom states she feeds 15 on the right 10 minutes on the left.  Mom's goal is to BF for 6 months then formula feed. Education provided on STS, feeding with cues, 8-12 feeds in 24 hours, and supply and demand. Hand expression demonstrated and illustrations from book shared as well as storage guidelines.    Mom has W[employer], [employer].  Manual pump provided and flange was correct fit. Personal electric at home.  LC encouraged mom to call out for assistance or questions.       Maternal Data Has patient been taught Hand Expression?: Yes Does the patient have breastfeeding experience prior to this delivery?: Yes How long did the patient breastfeed?: approx 3-4 months, first child had pulmonary issues and was in the hospital some/ so mom had to pump and breastfeed  Feeding Mother's Current Feeding Choice: Breast Milk  LATCH Score Latch: Grasps breast easily, tongue down, lips flanged, rhythmical sucking.  Audible Swallowing: None  Type of Nipple: Everted at rest and after stimulation  Comfort (Breast/Nipple): Soft / non-tender  Hold (Positioning): Assistance needed to correctly position infant at breast and maintain latch. (pillow support provided to bring baby closer to the breast)  LATCH Score: 7   Lactation Tools Discussed/Used Tools: Pump Breast pump type: Manual (provided for mom for home use) Pump Education: Milk Storage;Setup, frequency, and cleaning  Interventions Interventions: Breast feeding basics reviewed;Hand express;Hand pump;Education  Discharge Pump:  Manual  Consult Status Consult Status: Follow-up Date: 12/06/21 Follow-up type: In-patient    Ashley Payne [facility] 12/05/2021, 10:19 AM

## 2021-12-05 NOTE — Transfer of Care (Signed)
Immediate Anesthesia Transfer of Care Note  Patient: Ashley Payne  Procedure(s) Performed: POST PARTUM TUBAL LIGATION (Bilateral)  Patient Location: PACU  Anesthesia Type:Epidural  Level of Consciousness: awake  Airway & Oxygen Therapy: Patient Spontanous Breathing  Post-op Assessment: Report given to RN  Post vital signs: Reviewed and stable  Last Vitals:  Vitals Value Taken Time  BP 108/65 12/05/21 1316  Temp    Pulse 81 12/05/21 1329  Resp 17 12/05/21 1329  SpO2 99 % 12/05/21 1329  Vitals shown include unvalidated device data.  Last Pain:  Vitals:   12/05/21 0732  TempSrc: Oral  PainSc: 1          Complications: No notable events documented.

## 2021-12-05 NOTE — Anesthesia Postprocedure Evaluation (Signed)
Anesthesia Post Note  Patient: Ashley Payne  Procedure(s) Performed: POST PARTUM TUBAL LIGATION (Bilateral)     Patient location during evaluation: Mother Baby Anesthesia Type: Epidural Level of consciousness: oriented and awake and alert Pain management: pain level controlled Vital Signs Assessment: post-procedure vital signs reviewed and stable Respiratory status: spontaneous breathing and respiratory function stable Cardiovascular status: blood pressure returned to baseline and stable Postop Assessment: no headache, no backache, no apparent nausea or vomiting and able to ambulate Anesthetic complications: no   No notable events documented.  Last Vitals:  Vitals:   12/05/21 1412 12/05/21 1548  BP: 117/70   Pulse: 80 88  Resp: 16 16  Temp: 36.8 C   SpO2: 99% 100%    Last Pain:  Vitals:   12/05/21 1549  TempSrc:   PainSc: 6    Pain Goal:                   Trevor Iha

## 2021-12-06 MED ORDER — DEXMEDETOMIDINE HCL IN NACL 80 MCG/20ML IV SOLN
INTRAVENOUS | Status: AC
  Filled 2021-12-06: qty 20

## 2021-12-06 MED ORDER — SODIUM BICARBONATE 8.4 % IV SOLN
INTRAVENOUS | Status: AC
  Filled 2021-12-06: qty 50

## 2021-12-06 NOTE — Lactation Note (Addendum)
This note was copied from a baby's chart. Lactation Consultation Note  Patient Name: Ashley Payne [id number] Date: 12/06/2021 Reason for consult: Follow-up assessment Age:31 hours   P2 mother whose infant is now 13 hours old.  This is a term baby at 40+3 weeks.  Mother's current feeding preference is breast/formula.  Spanish interpreter, [name] [phone]) used for interpretation.  Mother's goal is to breast feed for 6 months and then change to formula.  When I arrived she had just finished breast feeding, however, baby was still cueing.  Mother desires to provide formula supplementation.  Last LATCH score was an 8; voiding/stooling.  RN had reported to me prior to entering the room that baby's respirations had increased today to approximately 70.  He is easily excitable so she has been monitoring him.  Family is planning on being discharged.  I observed him also breathing 70 bpm after mother finished feeding but he was not content.  Suggested mother provide the supplementation and I would alert the RN to return for a check when he is calm and quiet.  Mother verbalized understanding.  RN updated.   Maternal Data    Feeding Nipple Type: Slow - flow  LATCH Score                    Lactation Tools Discussed/Used    Interventions Interventions: Education;Breast feeding basics reviewed  Discharge Discharge Education: Engorgement and breast care  Consult Status Consult Status: Complete Date: 12/07/21 Follow-up type: In-patient    Ashley Payne R Ashley Payne 12/06/2021, 1:28 PM

## 2021-12-08 ENCOUNTER — Inpatient Hospital Stay (HOSPITAL_COMMUNITY)
Admission: AD | Admit: 2021-12-08 | Payer: Medicaid Other | Source: Home / Self Care | Admitting: Obstetrics and Gynecology

## 2021-12-08 ENCOUNTER — Inpatient Hospital Stay (HOSPITAL_COMMUNITY): Payer: Medicaid Other

## 2021-12-14 ENCOUNTER — Telehealth (HOSPITAL_COMMUNITY): Payer: Self-pay | Admitting: *Deleted

## 2021-12-14 NOTE — Telephone Encounter (Signed)
Attempted hospital discharge follow-up call with Language [employer] interpreter, Leretha Pol [phone]). No answer received. Deforest Hoyles, RN, 12/14/21, [phone]

## 2022-01-09 ENCOUNTER — Ambulatory Visit: Payer: Medicaid Other | Admitting: Family Medicine

## 2022-11-07 ENCOUNTER — Ambulatory Visit (HOSPITAL_COMMUNITY)
Admission: EM | Admit: 2022-11-07 | Discharge: 2022-11-07 | Disposition: A | Discharge status: Home / Self Care | Payer: Medicaid Other | Attending: Emergency Medicine | Admitting: Emergency Medicine

## 2022-11-07 ENCOUNTER — Encounter (HOSPITAL_COMMUNITY): Payer: Self-pay

## 2022-11-07 DIAGNOSIS — R109 Unspecified abdominal pain: Secondary | ICD-10-CM | POA: Insufficient documentation

## 2022-11-07 LAB — POCT URINALYSIS DIP (MANUAL ENTRY)
Bilirubin, UA: NEGATIVE
Blood, UA: NEGATIVE
Glucose, UA: NEGATIVE mg/dL
Ketones, POC UA: NEGATIVE mg/dL
Leukocytes, UA: NEGATIVE
Nitrite, UA: NEGATIVE
Protein Ur, POC: NEGATIVE mg/dL
Spec Grav, UA: 1.025 (ref 1.010–1.025)
Urobilinogen, UA: 0.2 E.U./dL
pH, UA: 6 (ref 5.0–8.0)

## 2022-11-07 LAB — POCT URINE PREGNANCY: Preg Test, Ur: NEGATIVE

## 2022-11-07 MED ORDER — DICLOFENAC SODIUM 25 MG PO TBEC: 25.0000 mg | DELAYED_RELEASE_TABLET | Freq: Two times a day (BID) | ORAL | 0 refills | Status: AC

## 2022-11-07 NOTE — Discharge Instructions (Addendum)
En general, su examen fsico fue tranquilizador. Creo que tiene una distensin musculoesqueltica, tome Voltaren dos veces al da [employer] prximos [address]. Si esto es [employer], puedes tomar 800 mg de ibuprofeno cada 8 horas. Puedes calentar, aplicar hielo y estirar suavemente la zona.  Su orina no mostr ningn signo de infeccin y su prueba de Psychiatrist en orina fue negativa.  Busque atencin inmediata si presenta vmitos, fiebre o un empeoramiento repentino del dolor abdominal, ya que es posible que necesite imgenes abdominales o una evaluacin adicional.  Overall your physical exam was reassuring.  I believe you have a musculoskeletal strain, please take the Voltaren twice daily for the next 10 days.  If this is too expensive, you can do 800 mg of ibuprofen every 8 hours.  You can heat, ice and gently stretch the area.  Your urine did not show any signs of infection, and your urine pregnancy test was negative.  Please seek immediate care if you develop vomiting, fever, or sudden worsening of abdominal pain, as you may need abdominal imaging or further evaluation.

## 2022-11-07 NOTE — ED Provider Notes (Signed)
[facility]    CSN: [id number] Arrival date & time: 11/07/22  1057      History   Chief Complaint Chief Complaint  Patient presents with   Flank Pain    HPI Ashley Payne is a 32 y.o. female.   Patient presents to the clinic for right flank pain/rib cage pain for the past 2 days.  Reports it is mostly in her ribs and radiates towards her back.  She denies dysuria, fevers, nausea or vomiting.  Pain is elicited with movement.  She is being her infant, who is quite large.  Denies any known injury, trauma or falls.  Has had a C-section and a tubal ligation, no other abdominal surgeries.  The history is provided by the patient and medical records.  Flank Pain Pertinent negatives include no abdominal pain.    Past Medical History:  Diagnosis Date   Medical history non-contributory     Patient Active Problem List   Diagnosis Date Noted   Normal labor 12/04/2021   GBS (group B Streptococcus carrier), +RV culture, currently pregnant 11/11/2021   Supervision of high risk pregnancy, antepartum 04/30/2021   History of cesarean section 04/30/2021   Language barrier 06/21/2019   History of recurrent miscarriages 05/14/2019    Past Surgical History:  Procedure Laterality Date   CESAREAN SECTION     NO PAST SURGERIES     TUBAL LIGATION Bilateral 12/05/2021   Procedure: POST PARTUM TUBAL LIGATION;  Surgeon: Myna Hidalgo, DO;  Location: [facility];  Service: Gynecology;  Laterality: Bilateral;    OB History     Gravida  4   Para  2   Term  2   Preterm      AB  2   Living  2      SAB  2   IAB      Ectopic      Multiple  0   Live Births  2            Home Medications    Prior to Admission medications   Medication Sig Start Date End Date Taking? Authorizing Provider  diclofenac (VOLTAREN) 25 MG EC tablet Take 1 tablet (25 mg total) by mouth 2 (two) times daily for 10 days. 11/07/22 11/17/22 Yes Rinaldo Ratel, [place] N, FNP  famotidine  (PEPCID) 20 MG tablet Take 1 tablet (20 mg total) by mouth 2 (two) times daily. 06/27/21  Yes Levie Heritage, DO  Prenatal Vit-Fe Fumarate-FA (PRENATAL VITAMINS PO) Take by mouth.   Yes [provider]  acetaminophen (TYLENOL) 500 MG tablet Take 500 mg by mouth every 6 (six) hours as needed. Patient not taking: Reported on 08/22/2021    [provider]  ondansetron (ZOFRAN) 4 MG tablet Take 1 tablet (4 mg total) by mouth every 8 (eight) hours as needed for nausea or vomiting. Patient not taking: Reported on 11/28/2021 09/23/21   Levie Heritage, DO  zolpidem (AMBIEN) 5 MG tablet Take 1 tablet (5 mg total) by mouth at bedtime as needed for up to 4 days for sleep. 12/03/21 12/07/21  Calvert Cantor, CNM  norgestimate-ethinyl estradiol (ORTHO-CYCLEN) 0.25-35 MG-MCG tablet Take 1 tablet by mouth daily. 05/12/19 06/09/19  Rasch, Harolyn Rutherford, NP    Family History Family History  Problem Relation Age of Onset   Healthy Mother    Healthy Father     Social History Social History   Tobacco Use   Smoking status: Never   Smokeless tobacco: Never  Vaping Use   Vaping Use: Never used  Substance Use Topics   Alcohol use: No   Drug use: Never     Allergies   Patient has no known allergies.   Review of Systems Review of Systems  Constitutional:  Negative for fever.  Gastrointestinal:  Negative for abdominal pain, diarrhea, nausea and vomiting.  Genitourinary:  Positive for flank pain. Negative for dysuria.     Physical Exam Triage Vital Signs ED Triage Vitals [11/07/22 1204]  Enc Vitals Group     BP 110/62     Pulse Rate (!) 107     Resp 18     Temp 98.1 F (36.7 C)     Temp Source Oral     SpO2 95 %     Weight      Height      Head Circumference      Peak Flow      Pain Score      Pain Loc      Pain Edu?      Excl. in GC?    No data found.  Updated Vital Signs BP 110/62 (BP Location: Left Arm)   Pulse (!) 107   Temp 98.1 F (36.7 C) (Oral)   Resp  18   SpO2 95%   Breastfeeding Yes   Visual Acuity Right Eye Distance:   Left Eye Distance:   Bilateral Distance:    Right Eye Near:   Left Eye Near:    Bilateral Near:     Physical Exam Vitals and nursing note reviewed.  Constitutional:      Appearance: Normal appearance.  HENT:     Head: Normocephalic and atraumatic.     Right Ear: External ear normal.     Left Ear: External ear normal.     Nose: Nose normal.     Mouth/Throat:     Mouth: Mucous membranes are moist.  Eyes:     Conjunctiva/sclera: Conjunctivae normal.  Cardiovascular:     Rate and Rhythm: Normal rate.  Pulmonary:     Effort: Pulmonary effort is normal. No respiratory distress.  Abdominal:     General: Abdomen is flat. Bowel sounds are normal. There is no distension.     Palpations: Abdomen is soft. There is no mass.     Tenderness: There is no abdominal tenderness. There is no right CVA tenderness, left CVA tenderness, guarding or rebound.     Hernia: No hernia is present.  Musculoskeletal:        General: No swelling. Normal range of motion.  Skin:    General: Skin is warm and dry.  Neurological:     General: No focal deficit present.     Mental Status: She is alert and oriented to person, place, and time.  Psychiatric:        Mood and Affect: Mood normal.        Behavior: Behavior normal. Behavior is cooperative.      UC Treatments / Results  Labs (all labs ordered are listed, but only abnormal results are displayed) Labs Reviewed  POCT URINALYSIS DIP (MANUAL ENTRY) - Abnormal; Notable for the following components:      Result Value   Color, UA straw (*)    Clarity, UA hazy (*)    All other components within normal limits  URINE CULTURE  POCT URINE PREGNANCY    EKG   Radiology No results found.  Procedures Procedures (including critical care time)  Medications Ordered in UC Medications -  No data to display  Initial Impression / Assessment and Plan / UC Course  I have  reviewed the triage vital signs and the nursing notes.  Pertinent labs & imaging results that were available during my care of the patient were reviewed by me and considered in my medical decision making (see chart for details).  Vitals in triage reviewed, patient is hemodynamically stable.  Right-sided rib cage pain that radiates into her back, pain elicited with movement.  Abdomen is soft and nontender without guarding, rebound, mass or palpable hernia.  Afebrile.  Without tachycardia.  Without nausea or vomiting.  Low concern for acute abdomen or need for emergent imaging at this time.  Patient is breast-feeding a heavy infant, suspect musculoskeletal strain.  Will trial anti-inflammatories and a bland diet.  Plan of care, follow-up care and return precautions given, no questions at this time.    Final Clinical Impressions(s) / UC Diagnoses   Final diagnoses:  Right flank pain     Discharge Instructions      En general, su examen fsico fue tranquilizador. Creo que tiene una distensin musculoesqueltica, tome Voltaren dos veces al da [employer] prximos [address]. Si esto es [employer], puedes tomar 800 mg de ibuprofeno cada 8 horas. Puedes calentar, aplicar hielo y estirar suavemente la zona.  Su orina no mostr ningn signo de infeccin y su prueba de Psychiatrist en orina fue negativa.  Busque atencin inmediata si presenta vmitos, fiebre o un empeoramiento repentino del dolor abdominal, ya que es posible que necesite imgenes abdominales o una evaluacin adicional.  Overall your physical exam was reassuring.  I believe you have a musculoskeletal strain, please take the Voltaren twice daily for the next 10 days.  If this is too expensive, you can do 800 mg of ibuprofen every 8 hours.  You can heat, ice and gently stretch the area.  Your urine did not show any signs of infection, and your urine pregnancy test was negative.  Please seek immediate care if you develop vomiting, fever,  or sudden worsening of abdominal pain, as you may need abdominal imaging or further evaluation.     ED Prescriptions     Medication Sig Dispense Auth. Provider   diclofenac (VOLTAREN) 25 MG EC tablet Take 1 tablet (25 mg total) by mouth 2 (two) times daily for 10 days. 20 tablet [place], [place], [place]      PDMP not reviewed this encounter.   Skyelynn Rambeau, [place] N, [place] 11/07/22 [phone]

## 2022-11-07 NOTE — ED Triage Notes (Signed)
Pt is here for right flank pain x 2-3 days.  Denies any recent falls or trauma.

## 2022-11-08 LAB — URINE CULTURE

## 2022-11-28 ENCOUNTER — Other Ambulatory Visit: Payer: Self-pay

## 2022-11-28 ENCOUNTER — Emergency Department (HOSPITAL_COMMUNITY)
Admission: EM | Admit: 2022-11-28 | Discharge: 2022-11-28 | Disposition: A | Discharge status: Home / Self Care | Payer: Medicaid Other | Attending: Emergency Medicine | Admitting: Emergency Medicine

## 2022-11-28 ENCOUNTER — Encounter (HOSPITAL_COMMUNITY): Payer: Self-pay | Admitting: *Deleted

## 2022-11-28 DIAGNOSIS — R300 Dysuria: Secondary | ICD-10-CM | POA: Insufficient documentation

## 2022-11-28 DIAGNOSIS — R1031 Right lower quadrant pain: Secondary | ICD-10-CM | POA: Insufficient documentation

## 2022-11-28 DIAGNOSIS — N898 Other specified noninflammatory disorders of vagina: Secondary | ICD-10-CM | POA: Diagnosis not present

## 2022-11-28 LAB — WET PREP, GENITAL
Clue Cells Wet Prep HPF POC: NONE SEEN
Sperm: NONE SEEN
Trich, Wet Prep: NONE SEEN
WBC, Wet Prep HPF POC: <10 (ref <10)
Yeast Wet Prep HPF POC: NONE SEEN

## 2022-11-28 LAB — COMPREHENSIVE METABOLIC PANEL
ALT: 11 U/L (ref 0–44)
AST: 16 U/L (ref 15–41)
Albumin: 4.1 g/dL (ref 3.5–5.0)
Alkaline Phosphatase: 85 U/L (ref 38–126)
Anion gap: 11 (ref 5–15)
BUN: 6 mg/dL (ref 6–20)
CO2: 25 mmol/L (ref 22–32)
Calcium: 9.2 mg/dL (ref 8.9–10.3)
Chloride: 102 mmol/L (ref 98–111)
Creatinine, Ser: 0.92 mg/dL (ref 0.44–1.00)
GFR, Estimated: >60 mL/min (ref >60)
Glucose, Bld: 99 mg/dL (ref 70–99)
Potassium: 3.4 mmol/L — ABNORMAL LOW (ref 3.5–5.1)
Sodium: 138 mmol/L (ref 135–145)
Total Bilirubin: 0.9 mg/dL (ref 0.3–1.2)
Total Protein: 7.3 g/dL (ref 6.5–8.1)

## 2022-11-28 LAB — CBC
HCT: 41 % (ref 36.0–46.0)
Hemoglobin: 13.6 g/dL (ref 12.0–15.0)
MCH: 29.6 pg (ref 26.0–34.0)
MCHC: 33.2 g/dL (ref 30.0–36.0)
MCV: 89.1 fL (ref 80.0–100.0)
Platelets: 263 10*3/uL (ref 150–400)
RBC: 4.6 MIL/uL (ref 3.87–5.11)
RDW: 13.2 % (ref 11.5–15.5)
WBC: 9.5 10*3/uL (ref 4.0–10.5)
nRBC: 0 % (ref 0.0–0.2)

## 2022-11-28 LAB — URINALYSIS, ROUTINE W REFLEX MICROSCOPIC
Bilirubin Urine: NEGATIVE
Glucose, UA: NEGATIVE mg/dL
Hgb urine dipstick: NEGATIVE
Ketones, ur: NEGATIVE mg/dL
Nitrite: NEGATIVE
Protein, ur: NEGATIVE mg/dL
Specific Gravity, Urine: 1.003 — ABNORMAL LOW (ref 1.005–1.030)
pH: 6 (ref 5.0–8.0)

## 2022-11-28 LAB — HCG, SERUM, QUALITATIVE: Preg, Serum: NEGATIVE

## 2022-11-28 LAB — LIPASE, BLOOD: Lipase: 27 U/L (ref 11–51)

## 2022-11-28 MED ORDER — POTASSIUM CHLORIDE CRYS ER 20 MEQ PO TBCR
20.0000 meq | EXTENDED_RELEASE_TABLET | Freq: Once | ORAL | Status: AC
  Administered 2022-11-28: 20 meq via ORAL
  Filled 2022-11-28: qty 1

## 2022-11-28 NOTE — Discharge Instructions (Addendum)
It was pleasure caring for you today.  Pregnancy test was negative.  Pelvic exam swab was negative for bacterial vaginosis or yeast infection.  Urinalysis was negative for UTI.  I recommend following up with your gynecologist later this week.  Seek emergency care if experiencing any new or worsening symptoms.

## 2022-11-28 NOTE — ED Triage Notes (Signed)
C/o abd. Pain onset yest. RLQ LMP in May, c/o burning with urination

## 2022-11-28 NOTE — ED Provider Notes (Signed)
[place] EMERGENCY DEPARTMENT AT [facility] Provider Note   CSN: [id number] Arrival date & time: 11/28/22  1357     History  Chief Complaint  Patient presents with   Abdominal Pain    Ashley Payne is a [id number] 32 y.o. female complaining of RLQ abdominal discomfort and dysuria x4 days. Thinks it is related to right ovary. Feels mild throbbing sensation when walking. No pain while sitting/resting. LMP May 7-12. Endorses intermittent vaginal discharge. Sexually active with husband only.  Denies fever, chest pain, dyspnea, hematuria, dyparenunia, nausea, vomiting, diarrhea.   Abdominal Pain      Home Medications Prior to Admission medications   Medication Sig Start Date End Date Taking? Authorizing Provider  acetaminophen (TYLENOL) 500 MG tablet Take 500 mg by mouth every 6 (six) hours as needed. Patient not taking: Reported on 08/22/2021    [provider]  famotidine (PEPCID) 20 MG tablet Take 1 tablet (20 mg total) by mouth 2 (two) times daily. 06/27/21   Levie Heritage, DO  ondansetron (ZOFRAN) 4 MG tablet Take 1 tablet (4 mg total) by mouth every 8 (eight) hours as needed for nausea or vomiting. Patient not taking: Reported on 11/28/2021 09/23/21   Levie Heritage, DO  Prenatal Vit-Fe Fumarate-FA (PRENATAL VITAMINS PO) Take by mouth.    [provider]  zolpidem (AMBIEN) 5 MG tablet Take 1 tablet (5 mg total) by mouth at bedtime as needed for up to 4 days for sleep. 12/03/21 12/07/21  Calvert Cantor, CNM  norgestimate-ethinyl estradiol (ORTHO-CYCLEN) 0.25-35 MG-MCG tablet Take 1 tablet by mouth daily. 05/12/19 06/09/19  Rasch, Harolyn Rutherford, NP      Allergies    Patient has no known allergies.    Review of Systems   Review of Systems  Gastrointestinal:  Positive for abdominal pain.    Physical Exam Updated Vital Signs BP 107/72   Pulse 83   Temp 97.7 F (36.5 C) (Oral)   Resp 19   Ht 5\' 3"  (1.6 m)   Wt 78 kg   SpO2 100%    BMI 30.47 kg/m  Physical Exam Vitals and nursing note reviewed. Exam conducted with a chaperone present.  Constitutional:      General: She is not in acute distress.    Appearance: She is not ill-appearing, toxic-appearing or diaphoretic.  HENT:     Head: Normocephalic and atraumatic.     Mouth/Throat:     Mouth: Mucous membranes are moist.     Pharynx: No posterior oropharyngeal erythema.  Eyes:     General: No scleral icterus.       Right eye: No discharge.        Left eye: No discharge.     Conjunctiva/sclera: Conjunctivae normal.  Cardiovascular:     Rate and Rhythm: Normal rate and regular rhythm.     Pulses: Normal pulses.     Heart sounds: Normal heart sounds. No murmur heard. Pulmonary:     Effort: Pulmonary effort is normal.  Abdominal:     General: Abdomen is flat. Bowel sounds are normal. There is no distension.     Palpations: Abdomen is soft. There is no mass.     Tenderness: There is no abdominal tenderness.     Comments: No tenderness to palpation of all abdominal quadrants.  No skin changes of abdomen.  Genitourinary:    Comments: Pelvic exam with chaperone Meta Hatchet, RN - External: without ulcers, lesions, or lacerations - Vaginal Vault: without laceration,  pooled blood, clots, foreign body - Cervix: without discoloration, friability, or masses - Os: multiparous; closed without blood. Mild, thin, clearish-white discharge present  Musculoskeletal:     Right lower leg: No edema.     Left lower leg: No edema.  Skin:    General: Skin is warm and dry.     Findings: No rash.  Neurological:     General: No focal deficit present.     Mental Status: She is alert. Mental status is at baseline.  Psychiatric:        Mood and Affect: Mood normal.     ED Results / Procedures / Treatments   Labs (all labs ordered are listed, but only abnormal results are displayed) Labs Reviewed  COMPREHENSIVE METABOLIC PANEL - Abnormal; Notable for the following  components:      Result Value   Potassium 3.4 (*)    All other components within normal limits  URINALYSIS, ROUTINE W REFLEX MICROSCOPIC - Abnormal; Notable for the following components:   APPearance CLOUDY (*)    Specific Gravity, Urine 1.003 (*)    Leukocytes,Ua SMALL (*)    Bacteria, UA RARE (*)    All other components within normal limits  WET PREP, GENITAL  LIPASE, BLOOD  CBC  HCG, SERUM, QUALITATIVE    EKG None  Radiology No results found.  Procedures Procedures    Medications Ordered in ED Medications  potassium chloride SA (KLOR-CON M) CR tablet 20 mEq (20 mEq Oral Given 11/28/22 1802)    ED Course/ Medical Decision Making/ A&P                             Medical Decision Making Amount and/or Complexity of Data Reviewed Labs: ordered.    This patient presents to the ED for concern of RLQ abdominal pain, this involves an extensive number of treatment options, and is a complaint that carries with it a high risk of complications and morbidity.  The differential diagnosis includes gastroenteritis, colitis, small bowel obstruction, appendicitis, cholecystitis, pancreatitis, nephrolithiasis, UTI, pyleonephritis, ruptured ectopic pregnancy, PID, ovarian torsion.   Co morbidities that complicate the patient evaluation  none    Lab Tests:  I Ordered, and personally interpreted labs.  The pertinent results include:   CBC with differential: No concern for anemia or leukocytosis CMP: Mild hypokalemia (3.4) Lipase: Within normal limits UA: Not concerning for UTI hCG: Negative Wet prep: Negative    Problem List / ED Course / Critical interventions / Medication management  Patient presented for mild RLQ abdominal discomfort.  Patient states that the RLQ abdominal discomfort happens when she is walking and is very mild.  Patient also complaining of dysuria and intermittent vaginal discharge.  Physical exam unremarkable.  No tenderness to palpation of abdomen or  CVA tenderness.  UA without concern for infection.  Pregnancy test negative.  Lipase within normal limits.  CBC without leukocytosis or anemia.  CMP with mild hypokalemia-I provided patient with a oral potassium supplement here in the ED. Patient complaining of intermittent vaginal discharge.  Patient only sexually active with her husband and and declines wanting STD testing today.  Pelvic exam with mild thin clearish white discharge that was swabbed and sent for wet prep.  Wet prep negative.  No tenderness when palpating adnexal region on bimanual exam -less concerning for PID/tubo-ovarian abscess. Upon second interview, patient stating that she was diagnosed with PCOS at her last gynecology appointment.  I believe an  ovarian cyst could be what is causing her mild RLQ discomfort today.  It does not seem that she is having complications from ovarian cyst at this time.  Asked patient if she was able to have close follow-up with her gynecologist -she said yes.  I recommended patient follow-up with her gynecologist as.  Patient verbally endorsed understanding of plan. I have reviewed the patients home medicines and have made adjustments as needed Patient was given return precautions. Patient stable for discharge at this time.  Patient verbalized understanding of plan.  Ddx: These are considered less likely due to history of present illness and physical exam. -gastroenteritis: No vomiting in ED; no fever; rating p.o. intake -colitis: Denies diarrhea, patient afebrile  -small bowel obstruction: Last BM 2 days ago -appendicitis: Negative McBurney point, rebound tenderness, psoas, obturator sign  -cholecystitis: Negative Murphy sign; liver enzymes within normal limits -pancreatitis: No LUQ tenderness to palpation, lipase within normal limits -nephrolithiasis: Denies flank pain and urinary complaints  -UTI/pyelonephritis: Denies urinary complaints  -ruptured ectopic pregnancy,  -PID/ovarian torsion: no  tenderness with bimanual/adnexal exam   Social Determinants of Health:  Foreign language     This note has been dictated using Teaching laboratory technician. Unfortunately, this method of dictation can sometimes lead to typographical or grammatical errors. I apologize for your inconvenience in advance if this occurs. Please do not hesitate to reach out to me if clarification is needed.             Final Clinical Impression(s) / ED Diagnoses Final diagnoses:  RLQ abdominal pain    Rx / DC Orders ED Discharge Orders     None         Margarita Rana 11/28/22 2016    Tegeler, Canary Brim, MD 11/29/22 [phone]

## 2022-12-02 ENCOUNTER — Ambulatory Visit: Payer: Medicaid Other | Attending: Nurse Practitioner | Admitting: Nurse Practitioner

## 2022-12-02 ENCOUNTER — Encounter: Payer: Self-pay | Admitting: Nurse Practitioner

## 2022-12-02 VITALS — BP 106/74 | HR 81 | Ht 63.0 in | Wt 157.8 lb

## 2022-12-02 DIAGNOSIS — Z7689 Persons encountering health services in other specified circumstances: Secondary | ICD-10-CM

## 2022-12-02 DIAGNOSIS — E876 Hypokalemia: Secondary | ICD-10-CM

## 2022-12-02 NOTE — Patient Instructions (Signed)
Bananas, oranges, cantaloupe, honeydew, apricots, grapefruit (some dried fruits, such as prunes, raisins, and dates, are also high in potassium) Cooked spinach Cooked broccoli Potatoes Sweet potatoes Mushrooms Peas Cucumbers Zucchini Pumpkins Leafy greens Orange juice  Tomato juice Prune juice Apricot juice Grapefruit juice milk yogurt (ACTIVIA) Tuna Halibut Cod Trout Rockfish Beans or legumes that are high in potassium include: Lima beans Pinto beans Kidney beans Soybeans Lentils Nuts Meat and poultry Brown and wild rice Bran cereal Whole-wheat bread and pasta  

## 2022-12-02 NOTE — Progress Notes (Signed)
Assessment & Plan:  Ashley Payne was seen today for establish care.  Diagnoses and all orders for this visit:  Encounter to establish care Follow up for physical.   Hypokalemia -     Potassium    Patient has been counseled on age-appropriate routine health concerns for screening and prevention. These are reviewed and up-to-date. Referrals have been placed accordingly. Immunizations are up-to-date or declined.    Subjective:   Chief Complaint  Patient presents with   Establish Care   HPI Ashley Payne 32 y.o. female presents to office today to establish care.   She is accompanied by an onsite interpreter.   Has no questions or concerns today outside of a recent potassium level being low and requesting to recheck.   Review of Systems  Constitutional:  Negative for fever, malaise/fatigue and weight loss.  HENT: Negative.  Negative for nosebleeds.   Eyes: Negative.  Negative for blurred vision, double vision and photophobia.  Respiratory: Negative.  Negative for cough and shortness of breath.   Cardiovascular: Negative.  Negative for chest pain, palpitations and leg swelling.  Gastrointestinal: Negative.  Negative for heartburn, nausea and vomiting.  Musculoskeletal: Negative.  Negative for myalgias.  Neurological: Negative.  Negative for dizziness, focal weakness, seizures and headaches.  Psychiatric/Behavioral: Negative.  Negative for suicidal ideas.     Past Medical History:  Diagnosis Date   Medical history non-contributory     Past Surgical History:  Procedure Laterality Date   CESAREAN SECTION  2021   NO PAST SURGERIES     TUBAL LIGATION Bilateral 12/05/2021   Procedure: POST PARTUM TUBAL LIGATION;  Surgeon: Myna Hidalgo, DO;  Location: [facility];  Service: Gynecology;  Laterality: Bilateral;    Family History  Problem Relation Age of Onset   Healthy Mother    Healthy Father     Social History Reviewed with no changes to be made today.    Outpatient Medications Prior to Visit  Medication Sig Dispense Refill   ondansetron (ZOFRAN) 4 MG tablet Take 1 tablet (4 mg total) by mouth every 8 (eight) hours as needed for nausea or vomiting. 60 tablet 3   acetaminophen (TYLENOL) 500 MG tablet Take 500 mg by mouth every 6 (six) hours as needed. (Patient not taking: Reported on 08/22/2021)     famotidine (PEPCID) 20 MG tablet Take 1 tablet (20 mg total) by mouth 2 (two) times daily. (Patient not taking: Reported on 12/02/2022) 60 tablet 3   Prenatal Vit-Fe Fumarate-FA (PRENATAL VITAMINS PO) Take by mouth. (Patient not taking: Reported on 12/02/2022)     zolpidem (AMBIEN) 5 MG tablet Take 1 tablet (5 mg total) by mouth at bedtime as needed for up to 4 days for sleep. 4 tablet 0   No facility-administered medications prior to visit.    No Known Allergies     Objective:    BP 106/74   Pulse 81   Ht 5\' 3"  (1.6 m)   LMP 10/07/2022   SpO2 99%   Breastfeeding Yes   BMI 30.47 kg/m  Wt Readings from Last 3 Encounters:  11/28/22 172 lb (78 kg)  12/04/21 196 lb (88.9 kg)  11/28/21 194 lb (88 kg)    Physical Exam Vitals and nursing note reviewed.  Constitutional:      Appearance: She is well-developed.  HENT:     Head: Normocephalic and atraumatic.  Cardiovascular:     Rate and Rhythm: Normal rate and regular rhythm.     Heart sounds: Normal  heart sounds. No murmur heard.    No friction rub. No gallop.  Pulmonary:     Effort: Pulmonary effort is normal. No tachypnea or respiratory distress.     Breath sounds: Normal breath sounds. No decreased breath sounds, wheezing, rhonchi or rales.  Chest:     Chest wall: No tenderness.  Abdominal:     General: Bowel sounds are normal.     Palpations: Abdomen is soft.  Musculoskeletal:        General: Normal range of motion.     Cervical back: Normal range of motion.  Skin:    General: Skin is warm and dry.  Neurological:     Mental Status: She is alert and oriented to person, place,  and time.     Coordination: Coordination normal.  Psychiatric:        Behavior: Behavior normal. Behavior is cooperative.        Thought Content: Thought content normal.        Judgment: Judgment normal.          Patient has been counseled extensively about nutrition and exercise as well as the importance of adherence with medications and regular follow-up. The patient was given clear instructions to go to ER or return to medical center if symptoms don't improve, worsen or new problems develop. The patient verbalized understanding.   Follow-up: Return for physical 2-3 months.   Claiborne Rigg, FNP-BC [facility] and [facility] [place], [place] [phone]   12/02/2022, 9:39 AM

## 2022-12-03 LAB — POTASSIUM: Potassium: 4.1 mmol/L (ref 3.5–5.2)

## 2022-12-12 ENCOUNTER — Other Ambulatory Visit: Payer: Self-pay | Admitting: Family Medicine

## 2022-12-12 DIAGNOSIS — O0990 Supervision of high risk pregnancy, unspecified, unspecified trimester: Secondary | ICD-10-CM

## 2023-01-29 ENCOUNTER — Ambulatory Visit: Payer: Medicaid Other | Admitting: Family Medicine

## 2023-02-23 IMAGING — US US MFM OB FOLLOW-UP
1 series · 14 of 24 positions shown · non-contrast
Comparison: none

[Series 1: us mfm ob follow-up · 14 of 24 slices shown]
[im 1/24]
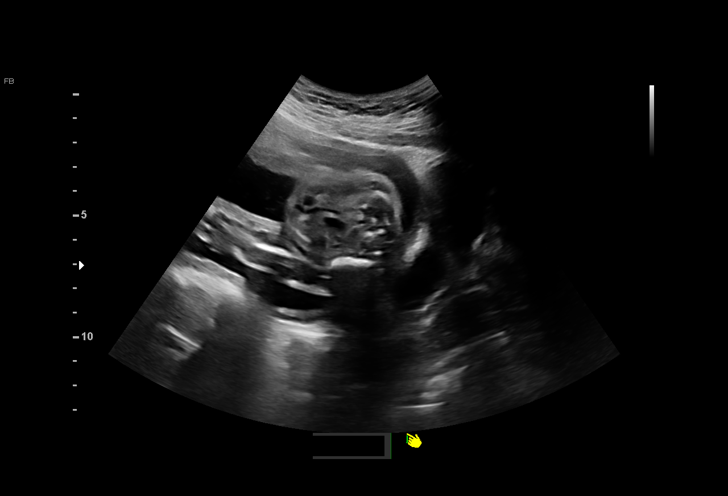
[im 3/24]
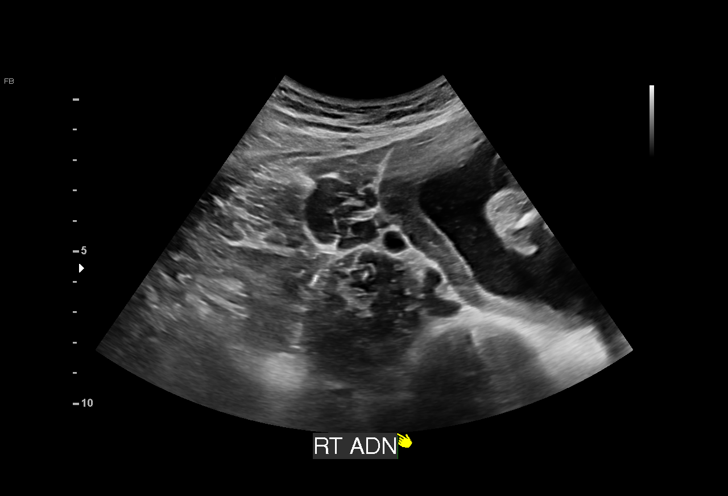
[im 5/24]
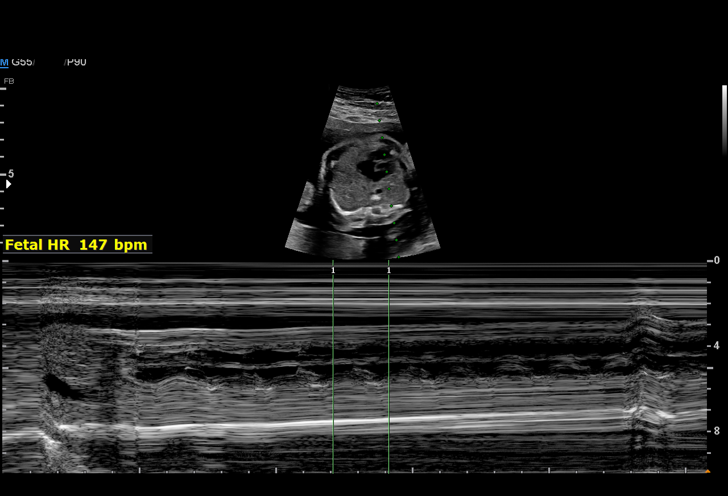
[im 7/24]
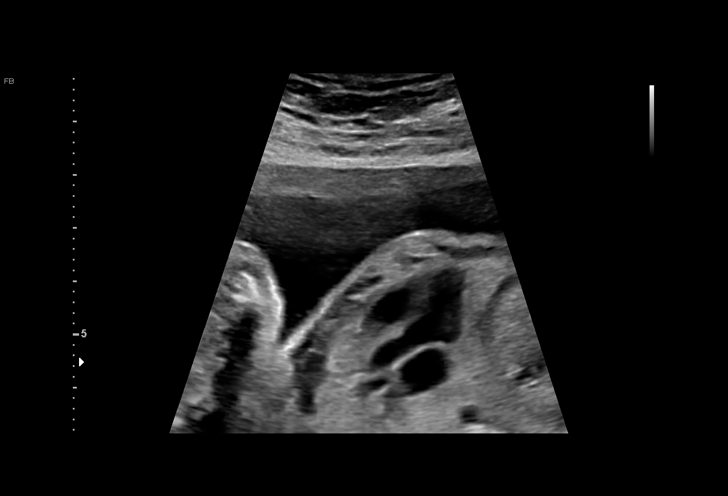
[im 8/24]
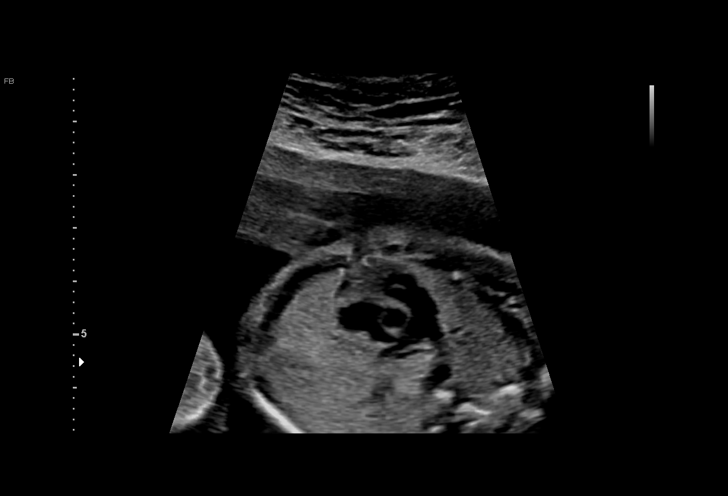
[im 10/24]
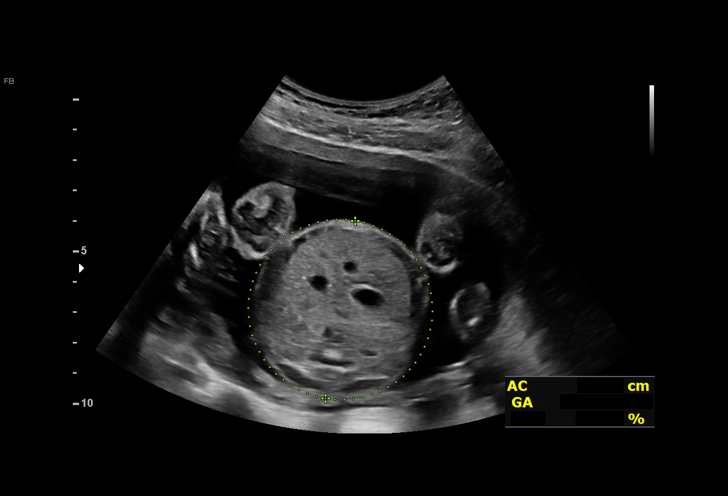
[im 12/24]
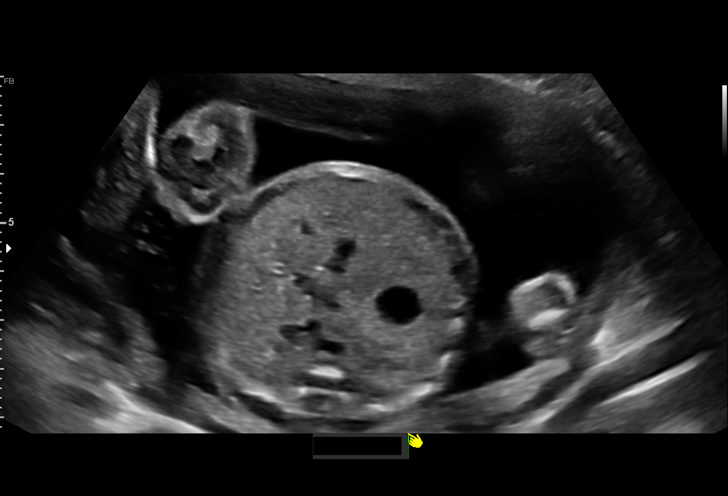
[im 13/24]
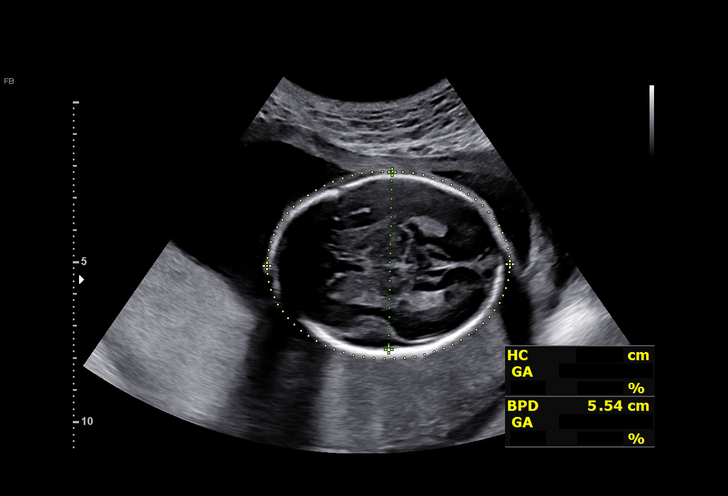
[im 15/24]
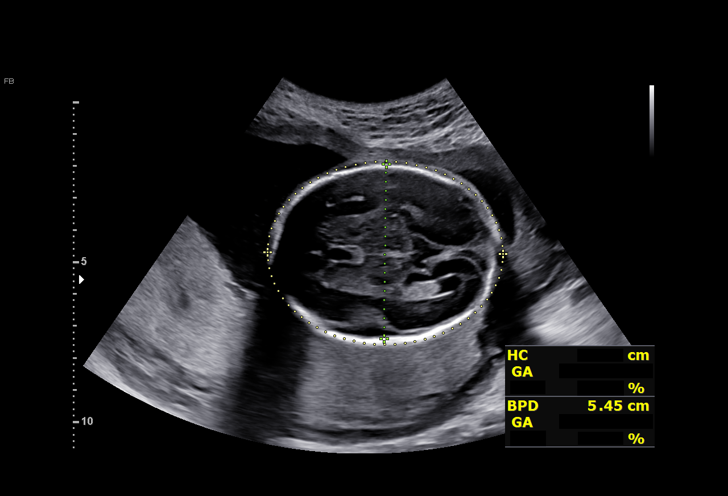
[im 17/24]
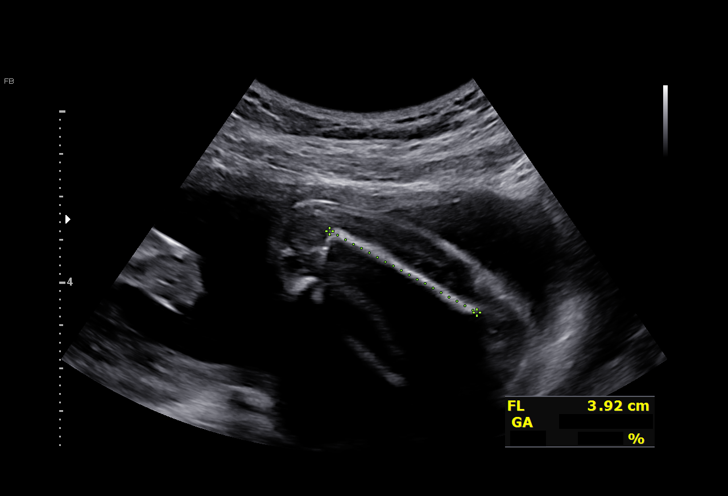
[im 19/24]
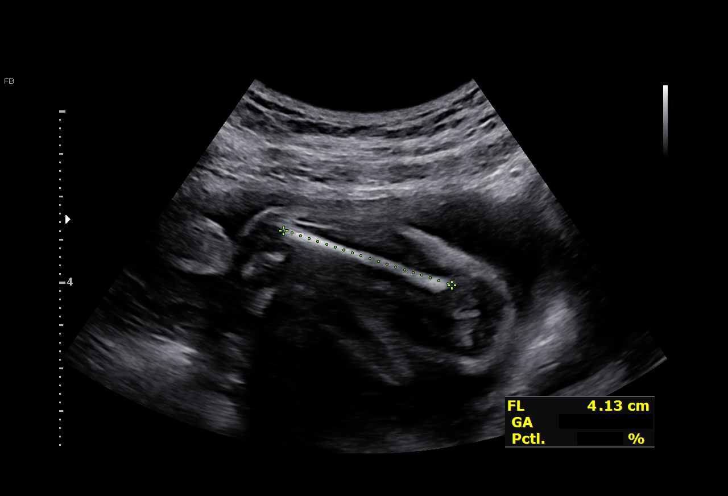
[im 20/24]
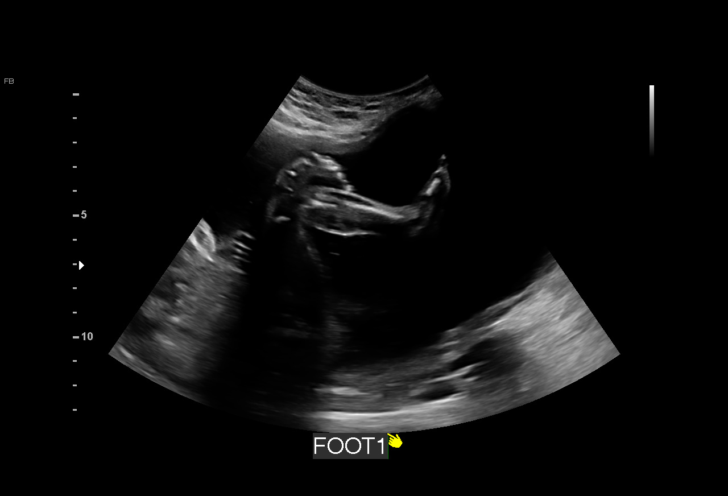
[im 22/24]
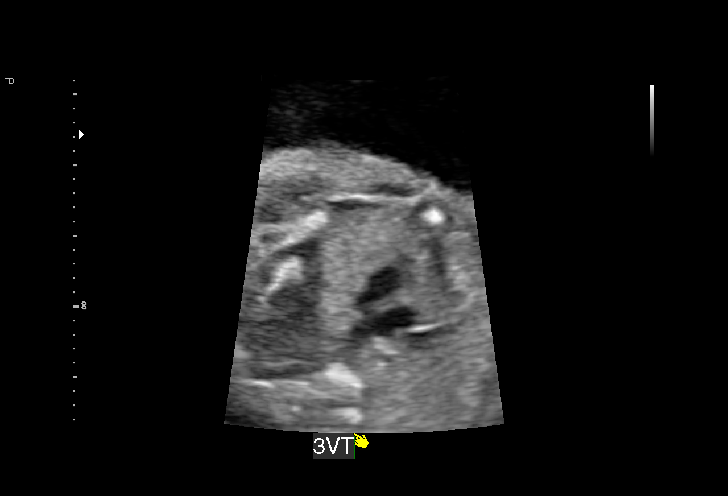
[im 24/24]
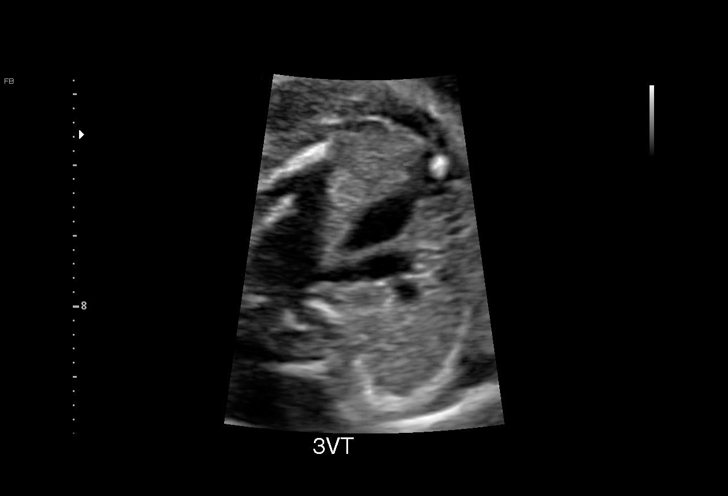

[14 of 24 positions shown; findings below may reference images not displayed]

FREDDY OSCAR

Indications

 23 weeks gestation of pregnancy
 Previous cesarean delivery, antepartum
 Neg AFP/ NIPS ordered
Fetal Evaluation

 Num Of Fetuses:         1
 Fetal Heart Rate(bpm):  147
 Cardiac Activity:       Observed
 Presentation:           Breech
 Placenta:               Posterior
 P. Cord Insertion:      Previously Visualized

 Amniotic Fluid
 AFI FV:      Within normal limits

                             Largest Pocket(cm)

Biometry

 BPD:      54.9  mm     G. Age:  22w 5d         20  %    CI:         69.9   %    70 - 86
                                                         FL/HC:      19.4   %    19.2 -
 HC:      209.5  mm     G. Age:  23w 0d         20  %    HC/AC:      1.12        1.05 -
 AC:      187.1  mm     G. Age:  23w 3d         43  %    FL/BPD:     74.1   %    71 - 87
 FL:       40.7  mm     G. Age:  23w 1d         30  %    FL/AC:      21.8   %    20 - 24
 LV:        4.3  mm

 Est. FW:     581  gm      1 lb 4 oz     35  %
Gestational Age

 LMP:           23w 3d        Date:  02/25/21                 EDD:   12/02/21
 U/S Today:     23w 1d                                        EDD:   12/04/21
 Best:          23w 3d     Det. By:  LMP  (02/25/21)          EDD:   12/02/21
Anatomy

 Cranium:               Previously seen        LVOT:                   Appears normal
 Cavum:                 Previously seen        Aortic Arch:            Previously seen
 Ventricles:            Appears normal         Ductal Arch:            Previously seen
 Choroid Plexus:        Previously seen        Diaphragm:              Previously seen
 Cerebellum:            Previously seen        Stomach:                Appears normal, left
                                                                       sided
 Posterior Fossa:       Previously seen        Abdomen:                Previously seen
 Nuchal Fold:           Previously seen        Abdominal Wall:         Previously seen
 Face:                  Orbits and profile     Cord Vessels:           Previously seen
                        previously seen
 Lips:                  Previously seen        Kidneys:                Appear normal
 Palate:                Not well visualized    Bladder:                Appears normal
 Thoracic:              Appears normal         Spine:                  Previously seen
 Heart:                 Appears normal         Upper Extremities:      Visualized
                        (4CH, axis, and
                        situs)
 RVOT:                  Appears normal         Lower Extremities:      Visualized

 Other:  Male gender previously seen. Nasal bone, Lenses, 3VV, Feet and
         open hands/5th digits previously visualized. Heels not well visualized.
Targeted Anatomy

 Thorax
 3 V Trachea View:      Appears normal
Cervix Uterus Adnexa

 Adnexa
 No abnormality visualized.
Impression

 Patient returned for completion of fetal anatomy .Amniotic
 fluid is normal and good fetal activity is seen .Fetal biometry
 is consistent with her previously-established dates .Fetal
 anatomical survey was completed and appears normal.
Recommendations

 Follow-up scans as clinically indicated.
                 Tiger, Zeinab

## 2023-03-06 ENCOUNTER — Encounter: Payer: Self-pay | Admitting: Nurse Practitioner

## 2023-03-06 ENCOUNTER — Ambulatory Visit: Payer: Medicaid Other | Attending: Nurse Practitioner | Admitting: Nurse Practitioner

## 2023-03-06 VITALS — BP 109/70 | HR 88 | Ht 63.0 in | Wt 155.2 lb

## 2023-03-06 DIAGNOSIS — D72829 Elevated white blood cell count, unspecified: Secondary | ICD-10-CM

## 2023-03-06 DIAGNOSIS — Z0000 Encounter for general adult medical examination without abnormal findings: Secondary | ICD-10-CM

## 2023-03-06 DIAGNOSIS — M7918 Myalgia, other site: Secondary | ICD-10-CM | POA: Diagnosis not present

## 2023-03-06 DIAGNOSIS — Z23 Encounter for immunization: Secondary | ICD-10-CM | POA: Diagnosis not present

## 2023-03-06 MED ORDER — DICLOFENAC SODIUM 1 % EX GEL
4.0000 g | Freq: Four times a day (QID) | CUTANEOUS | 1 refills | Status: DC
Start: 2023-03-06 — End: 2023-12-04

## 2023-03-06 NOTE — Progress Notes (Signed)
Assessment & Plan:  Ashley "Aundria Daz Lestine Mount" was seen today for annual exam.  Diagnoses and all orders for this visit:  Encounter for annual physical exam -     CMP14+EGFR -     CBC with Differential  Musculoskeletal pain -     diclofenac Sodium (VOLTAREN) 1 % GEL; Apply 4 g topically 4 (four) times daily.  Leukocytosis, unspecified type -     CMP14+EGFR -     CBC with Differential  Encounter for immunization -     Flu vaccine trivalent PF, 6mos and older(Flulaval,Afluria,Fluarix,Fluzone)    Patient has been counseled on age-appropriate routine health concerns for screening and prevention. These are reviewed and up-to-date. Referrals have been placed accordingly. Immunizations are up-to-date or declined.    Subjective:   Chief Complaint  Patient presents with   Annual Exam   HPI Ashley Payne 32 y.o. female presents to office today for annual physical  She endorses pain under the left ribcage below the left breast. Pain is reproducible. Denies any injury or trauma.    Review of Systems  Constitutional:  Negative for fever, malaise/fatigue and weight loss.  HENT: Negative.  Negative for nosebleeds.   Eyes: Negative.  Negative for blurred vision, double vision and photophobia.  Respiratory: Negative.  Negative for cough and shortness of breath.   Cardiovascular: Negative.  Negative for chest pain, palpitations and leg swelling.  Gastrointestinal: Negative.  Negative for heartburn, nausea and vomiting.  Genitourinary: Negative.   Musculoskeletal:  Positive for myalgias.       SEE HPI  Skin: Negative.   Neurological: Negative.  Negative for dizziness, focal weakness, seizures and headaches.  Endo/Heme/Allergies: Negative.   Psychiatric/Behavioral: Negative.  Negative for suicidal ideas.     Past Medical History:  Diagnosis Date   Medical history non-contributory     Past Surgical History:  Procedure Laterality Date   CESAREAN SECTION  2021    NO PAST SURGERIES     TUBAL LIGATION Bilateral 12/05/2021   Procedure: POST PARTUM TUBAL LIGATION;  Surgeon: Myna Hidalgo, DO;  Location: [facility];  Service: Gynecology;  Laterality: Bilateral;    Family History  Problem Relation Age of Onset   Healthy Mother    Healthy Father     Social History Reviewed with no changes to be made today.   Outpatient Medications Prior to Visit  Medication Sig Dispense Refill   ondansetron (ZOFRAN) 4 MG tablet TAKE 1 TABLET(4 MG) BY MOUTH EVERY 8 HOURS AS NEEDED FOR NAUSEA OR VOMITING 60 tablet 3   No facility-administered medications prior to visit.    No Known Allergies     Objective:    BP 109/70   Pulse 88   Ht 5\' 3"  (1.6 m)   Wt 155 lb 3.2 oz (70.4 kg)   SpO2 95%   Breastfeeding Yes   BMI 27.49 kg/m  Wt Readings from Last 3 Encounters:  03/06/23 155 lb 3.2 oz (70.4 kg)  12/02/22 157 lb 12.8 oz (71.6 kg)  11/28/22 172 lb (78 kg)    Physical Exam Constitutional:      Appearance: She is well-developed.  HENT:     Head: Normocephalic and atraumatic.     Right Ear: Hearing, tympanic membrane, ear canal and external ear normal.     Left Ear: Hearing, tympanic membrane, ear canal and external ear normal.     Nose: Nose normal.     Right Turbinates: Not enlarged.     Left Turbinates:  Not enlarged.     Mouth/Throat:     Lips: Pink.     Mouth: Mucous membranes are moist.     Dentition: No dental tenderness, gingival swelling, dental abscesses or gum lesions.     Pharynx: No oropharyngeal exudate.  Eyes:     General: No scleral icterus.       Right eye: No discharge.     Extraocular Movements: Extraocular movements intact.     Conjunctiva/sclera: Conjunctivae normal.     Pupils: Pupils are equal, round, and reactive to light.  Neck:     Thyroid: No thyromegaly.     Trachea: No tracheal deviation.  Cardiovascular:     Rate and Rhythm: Normal rate and regular rhythm.     Heart sounds: Normal heart sounds. No murmur  heard.    No friction rub.  Pulmonary:     Effort: Pulmonary effort is normal. No accessory muscle usage or respiratory distress.     Breath sounds: Normal breath sounds. No decreased breath sounds, wheezing, rhonchi or rales.  Chest:     Chest wall: Tenderness present. No mass, deformity, swelling, crepitus or edema. There is no dullness to percussion.  Breasts:    Right: Normal.     Left: Normal.    Abdominal:     General: Bowel sounds are normal. There is no distension.     Palpations: Abdomen is soft. There is no mass.     Tenderness: There is no abdominal tenderness. There is no right CVA tenderness, left CVA tenderness, guarding or rebound.     Hernia: No hernia is present.  Musculoskeletal:        General: No tenderness or deformity. Normal range of motion.     Cervical back: Normal range of motion and neck supple.  Lymphadenopathy:     Cervical: No cervical adenopathy.     Upper Body:     Right upper body: No supraclavicular, axillary or pectoral adenopathy.     Left upper body: No supraclavicular, axillary or pectoral adenopathy.  Skin:    General: Skin is warm and dry.     Findings: No erythema.  Neurological:     Mental Status: She is alert and oriented to person, place, and time.     Cranial Nerves: No cranial nerve deficit.     Motor: Motor function is intact.     Coordination: Coordination is intact. Coordination normal.     Gait: Gait is intact.     Deep Tendon Reflexes:     Reflex Scores:      Patellar reflexes are 1+ on the right side and 1+ on the left side. Psychiatric:        Attention and Perception: Attention normal.        Mood and Affect: Mood normal.        Speech: Speech normal.        Behavior: Behavior normal.        Thought Content: Thought content normal.        Judgment: Judgment normal.          Patient has been counseled extensively about nutrition and exercise as well as the importance of adherence with medications and regular  follow-up. The patient was given clear instructions to go to ER or return to medical center if symptoms don't improve, worsen or new problems develop. The patient verbalized understanding.   Follow-up: Return if symptoms worsen or fail to improve.   Claiborne Rigg, FNP-BC [facility] [place], [place] [phone]   03/06/2023, 10:07 PM

## 2023-03-07 LAB — CBC WITH DIFFERENTIAL/PLATELET
Basophils Absolute: 0.1 10*3/uL (ref 0.0–0.2)
Basos: 1 %
EOS (ABSOLUTE): 0.1 10*3/uL (ref 0.0–0.4)
Eos: 1 %
Hematocrit: 40.3 % (ref 34.0–46.6)
Hemoglobin: 12.9 g/dL (ref 11.1–15.9)
Immature Grans (Abs): 0 10*3/uL (ref 0.0–0.1)
Immature Granulocytes: 1 %
Lymphocytes Absolute: 1.7 10*3/uL (ref 0.7–3.1)
Lymphs: 21 %
MCH: 29.5 pg (ref 26.6–33.0)
MCHC: 32 g/dL (ref 31.5–35.7)
MCV: 92 fL (ref 79–97)
Monocytes Absolute: 0.5 10*3/uL (ref 0.1–0.9)
Monocytes: 6 %
Neutrophils Absolute: 5.9 10*3/uL (ref 1.4–7.0)
Neutrophils: 70 %
Platelets: 270 10*3/uL (ref 150–450)
RBC: 4.37 x10E6/uL (ref 3.77–5.28)
RDW: 12.3 % (ref 11.7–15.4)
WBC: 8.3 10*3/uL (ref 3.4–10.8)

## 2023-03-07 LAB — CMP14+EGFR
ALT: 8 [IU]/L (ref 0–32)
AST: 12 [IU]/L (ref 0–40)
Albumin: 4.7 g/dL (ref 3.9–4.9)
Alkaline Phosphatase: 101 [IU]/L (ref 44–121)
BUN/Creatinine Ratio: 17 (ref 9–23)
BUN: 12 mg/dL (ref 6–20)
Bilirubin Total: 0.3 mg/dL (ref 0.0–1.2)
CO2: 24 mmol/L (ref 20–29)
Calcium: 9.8 mg/dL (ref 8.7–10.2)
Chloride: 103 mmol/L (ref 96–106)
Creatinine, Ser: 0.72 mg/dL (ref 0.57–1.00)
Globulin, Total: 2.5 g/dL (ref 1.5–4.5)
Glucose: 90 mg/dL (ref 70–99)
Potassium: 4.9 mmol/L (ref 3.5–5.2)
Sodium: 142 mmol/L (ref 134–144)
Total Protein: 7.2 g/dL (ref 6.0–8.5)
eGFR: 114 mL/min/{1.73_m2} (ref >59)

## 2023-04-02 ENCOUNTER — Ambulatory Visit: Payer: Medicaid Other | Admitting: Family Medicine

## 2023-04-02 ENCOUNTER — Encounter: Payer: Self-pay | Admitting: Family Medicine

## 2023-04-02 VITALS — BP 115/81 | HR 75 | Ht 63.0 in | Wt 153.0 lb

## 2023-04-02 DIAGNOSIS — N644 Mastodynia: Secondary | ICD-10-CM | POA: Diagnosis not present

## 2023-04-02 DIAGNOSIS — Z603 Acculturation difficulty: Secondary | ICD-10-CM | POA: Diagnosis not present

## 2023-04-02 DIAGNOSIS — Z01419 Encounter for gynecological examination (general) (routine) without abnormal findings: Secondary | ICD-10-CM

## 2023-04-02 DIAGNOSIS — Z758 Other problems related to medical facilities and other health care: Secondary | ICD-10-CM | POA: Diagnosis not present

## 2023-04-02 NOTE — Progress Notes (Signed)
Patient presents for Annual.  LMP: 02/15/23- irregular  Last pap: Date: 2022-wnl Contraception: Not sexually active Mammogram: Not yet indicated STD Screening: Declines Flu Vaccine :  already has gotten   CC:  Annual  Seen PCP 10/24 left breast pain, but pcp seen and felt and denies palpating in abnormalities  Wants to have it checked today also

## 2023-04-02 NOTE — Progress Notes (Signed)
ANNUAL EXAM Patient name: Talyssa Gibas MRN [id number]  Date of birth: 11/26/1990 Chief Complaint:   Annual Exam  History of Present Illness:   Fortune Torosian is a 32 y.o.  [phone]  female  being seen today for a routine annual exam.  Current complaints: intermittent self limiting left breast pain under her left breast. Still breastfeeding some. No discharge. Had visit with PCP - nothing felt on exam. No skin changes. Pain is close to the end of the underwire.  No LMP recorded.    Last pap 2022. Results were: NILM w/ HRHPV negative. H/O abnormal pap: no Last mammogram: n/a     03/06/2023    3:09 PM 12/02/2022    9:23 AM 11/08/2020    4:40 PM 07/07/2018    8:46 AM  Depression screen PHQ 2/9  Decreased Interest 0 0 0 0  Down, Depressed, Hopeless 0 0 0 0  PHQ - 2 Score 0 0 0 0  Altered sleeping 0 0 0   Tired, decreased energy 0 0 0   Change in appetite 0 0 0   Feeling bad or failure about yourself  0 0 0   Trouble concentrating 0 0 0   Moving slowly or fidgety/restless 0 0 0   Suicidal thoughts 0  0   PHQ-9 Score 0 0 0   Difficult doing work/chores   Not difficult at all         03/06/2023    3:09 PM 12/02/2022    9:24 AM 07/07/2018    8:46 AM  GAD 7 : Generalized Anxiety Score  Nervous, Anxious, on Edge 0 0 0  Control/stop worrying 0 0 0  Worry too much - different things 0 0 0  Trouble relaxing 0 0 0  Restless 0 0 0  Easily annoyed or irritable 0 0 0  Afraid - awful might happen 0 0 0  Total GAD 7 Score 0 0 0     Review of Systems:   Pertinent items are noted in HPI Denies any headaches, blurred vision, fatigue, shortness of breath, chest pain, abdominal pain, abnormal vaginal discharge/itching/odor/irritation, problems with periods, bowel movements, urination, or intercourse unless otherwise stated above. Pertinent History Reviewed:  Reviewed past medical,surgical, social and family history.  Reviewed problem list, medications and allergies. Physical  Assessment:   Vitals:   04/02/23 1405  BP: 115/81  Pulse: 75  Weight: 153 lb (69.4 kg)  Height: 5\' 3"  (1.6 m)  Body mass index is 27.1 kg/m.        Physical Examination:   General appearance - well appearing, and in no distress  Mental status - alert, oriented to person, place, and time  Psych:  She has a normal mood and affect  Skin - warm and dry, normal color, no suspicious lesions noted  Chest - effort normal, all lung fields clear to auscultation bilaterally  Heart - normal rate and regular rhythm  Neck:  midline trachea, no thyromegaly or nodules  Breasts - breasts appear normal, no suspicious masses, no skin or nipple changes or axillary nodes  Abdomen - soft, nontender, nondistended, no masses or organomegaly  Pelvic - VULVA: normal appearing vulva with no masses, tenderness or lesions  VAGINA: normal appearing vagina with normal color and discharge, no lesions  CERVIX: normal appearing cervix without discharge or lesions, no CMT  Thin prep pap is not done  UTERUS: uterus is felt to be normal size, shape, consistency and nontender   ADNEXA: No  adnexal masses or tenderness noted.  Extremities:  No swelling or varicosities noted  Chaperone present for exam  Assessment & Plan:  1. Well woman exam with routine gynecological exam PAP current  2. Breast pain, left No masses palpated. Recommended changing bra to one without an underwire for a few weeks to see if that is helpful If not, patient to reach out.  3. Language barrier Interpreter used.    No orders of the defined types were placed in this encounter.   Meds: No orders of the defined types were placed in this encounter.   Follow-up: No follow-ups on file.  Levie Heritage, DO 04/02/2023 2:42 PM

## 2023-05-03 ENCOUNTER — Ambulatory Visit (HOSPITAL_COMMUNITY)
Admission: EM | Admit: 2023-05-03 | Discharge: 2023-05-03 | Disposition: A | Discharge status: Home / Self Care | Payer: Medicaid Other

## 2023-05-03 NOTE — ED Notes (Signed)
Patient did not reappear to department

## 2023-05-03 NOTE — ED Notes (Signed)
Call patient from waiting area. No answer or voice mail.

## 2023-05-04 ENCOUNTER — Encounter (HOSPITAL_COMMUNITY): Payer: Self-pay | Admitting: Emergency Medicine

## 2023-05-04 ENCOUNTER — Ambulatory Visit (HOSPITAL_COMMUNITY)
Admission: EM | Admit: 2023-05-04 | Discharge: 2023-05-04 | Disposition: A | Discharge status: Home / Self Care | Payer: Medicaid Other | Attending: Family Medicine | Admitting: Family Medicine

## 2023-05-04 DIAGNOSIS — M545 Low back pain, unspecified: Secondary | ICD-10-CM

## 2023-05-04 LAB — POCT URINALYSIS DIP (MANUAL ENTRY)
Bilirubin, UA: NEGATIVE
Glucose, UA: NEGATIVE mg/dL
Ketones, POC UA: NEGATIVE mg/dL
Leukocytes, UA: NEGATIVE
Nitrite, UA: NEGATIVE
Protein Ur, POC: NEGATIVE mg/dL
Spec Grav, UA: 1.025 (ref 1.010–1.025)
Urobilinogen, UA: 0.2 U/dL
pH, UA: 6 (ref 5.0–8.0)

## 2023-05-04 MED ORDER — CYCLOBENZAPRINE HCL 5 MG PO TABS: 5.0000 mg | ORAL_TABLET | Freq: Three times a day (TID) | ORAL | 0 refills | Status: DC | PRN

## 2023-05-04 NOTE — ED Notes (Signed)
Ashley Payne #[id number]

## 2023-05-04 NOTE — Discharge Instructions (Addendum)
Please take your Flexeril as needed for your back  You may take ibuprofen as needed as well.

## 2023-05-04 NOTE — ED Provider Notes (Signed)
[facility]    CSN: [id number] Arrival date & time: 05/04/23  1101      History   Chief Complaint Chief Complaint  Patient presents with   Back Pain    HPI Ashley Payne is a 32 y.o. female.   Patient is presenting with right-sided lower back pain since Saturday.  Patient states that she did not cause any pain or any incident that caused her back pain.  Patient notes some mild burning when she pees but very little.  Patient has not tried any over-the-counter medications.  Patient is currently breast-feeding.  Patient states that the pain comes and goes randomly and there is no way to aggravate it or repeat the pain.  Patient has not tried any thing to make the pain better.   Back Pain   Past Medical History:  Diagnosis Date   Medical history non-contributory     Patient Active Problem List   Diagnosis Date Noted   Normal labor 12/04/2021   GBS (group B Streptococcus carrier), +RV culture, currently pregnant 11/11/2021   Supervision of high risk pregnancy, antepartum 04/30/2021   History of cesarean section 04/30/2021   Language barrier 06/21/2019   History of recurrent miscarriages 05/14/2019    Past Surgical History:  Procedure Laterality Date   CESAREAN SECTION  2021   NO PAST SURGERIES     TUBAL LIGATION Bilateral 12/05/2021   Procedure: POST PARTUM TUBAL LIGATION;  Surgeon: Myna Hidalgo, DO;  Location: [facility];  Service: Gynecology;  Laterality: Bilateral;    OB History     Gravida  4   Para  2   Term  2   Preterm      AB  2   Living  2      SAB  2   IAB      Ectopic      Multiple  0   Live Births  2            Home Medications    Prior to Admission medications   Medication Sig Start Date End Date Taking? Authorizing Provider  cyclobenzaprine (FLEXERIL) 5 MG tablet Take 1 tablet (5 mg total) by mouth 3 (three) times daily as needed for muscle spasms. 05/04/23  Yes Brenton Grills, MD  diclofenac Sodium  (VOLTAREN) 1 % GEL Apply 4 g topically 4 (four) times daily. Patient not taking: Reported on 04/02/2023 03/06/23   Claiborne Rigg, NP  ondansetron (ZOFRAN) 4 MG tablet TAKE 1 TABLET(4 MG) BY MOUTH EVERY 8 HOURS AS NEEDED FOR NAUSEA OR VOMITING Patient not taking: Reported on 04/02/2023 12/15/22   Levie Heritage, DO  norgestimate-ethinyl estradiol (ORTHO-CYCLEN) 0.25-35 MG-MCG tablet Take 1 tablet by mouth daily. 05/12/19 06/09/19  Rasch, Harolyn Rutherford, NP    Family History Family History  Problem Relation Age of Onset   Healthy Mother    Healthy Father     Social History Social History   Tobacco Use   Smoking status: Never   Smokeless tobacco: Never  Vaping Use   Vaping status: Never Used  Substance Use Topics   Alcohol use: No   Drug use: Never     Allergies   Patient has no known allergies.   Review of Systems Review of Systems  Musculoskeletal:  Positive for back pain.     Physical Exam Triage Vital Signs ED Triage Vitals  Encounter Vitals Group     BP 05/04/23 1150 109/75     Systolic BP Percentile --  Diastolic BP Percentile --      Pulse Rate 05/04/23 1150 92     Resp 05/04/23 1150 16     Temp 05/04/23 1150 98.2 F (36.8 C)     Temp Source 05/04/23 1150 Oral     SpO2 05/04/23 1150 97 %     Weight --      Height --      Head Circumference --      Peak Flow --      Pain Score 05/04/23 1154 6     Pain Loc --      Pain Education --      Exclude from Growth Chart --    No data found.  Updated Vital Signs BP 109/75 (BP Location: Left Arm)   Pulse 92   Temp 98.2 F (36.8 C) (Oral)   Resp 16   LMP 04/16/2023 (Exact Date)   SpO2 97%   Breastfeeding Yes   Visual Acuity Right Eye Distance:   Left Eye Distance:   Bilateral Distance:    Right Eye Near:   Left Eye Near:    Bilateral Near:     Physical Exam Lumbar spine:  - Inspection: no gross deformity or scoliosis; no swelling or ecchymosis. No skin changes - Palpation: No TTP over the  spinous processes, paraspinal muscles, or SI joints b/l - ROM: full active ROM of the lumbar spine in flexion and extension without pain - Strength: 5/5 strength of lower extremity in L4-S1 nerve root distributions b/l  *L1/L2: Hip Flexion & Abduction  *L3/L4: Knee Extension  *L4/L5: Ankle Dorsiflexion  *L5: Great Toe Extension  *S1: Ankle Plantar Flexion    UC Treatments / Results  Labs (all labs ordered are listed, but only abnormal results are displayed) Labs Reviewed  POCT URINALYSIS DIP (MANUAL ENTRY) - Abnormal; Notable for the following components:      Result Value   Color, UA orange (*)    Blood, UA trace-intact (*)    All other components within normal limits    EKG   Radiology No results found.  Procedures Procedures (including critical care time)  Medications Ordered in UC Medications - No data to display  Initial Impression / Assessment and Plan / UC Course  I have reviewed the triage vital signs and the nursing notes.  Pertinent labs & imaging results that were available during my care of the patient were reviewed by me and considered in my medical decision making (see chart for details).     Patient's back pain is likely paraspinal/muscular in nature.  At this time we will go ahead with ibuprofen and Flexeril as needed for pain.  Patient was advised to follow-up if any worsening symptoms.  Patient understanding and agreeable with plan Final Clinical Impressions(s) / UC Diagnoses   Final diagnoses:  Acute right-sided low back pain without sciatica     Discharge Instructions      Please take your Flexeril as needed for your back  You may take ibuprofen as needed as well.   ED Prescriptions     Medication Sig Dispense Auth. Provider   cyclobenzaprine (FLEXERIL) 5 MG tablet Take 1 tablet (5 mg total) by mouth 3 (three) times daily as needed for muscle spasms. 30 tablet Brenton Grills, MD      PDMP not reviewed this encounter.   Brenton Grills,  MD 05/04/23 1229

## 2023-05-04 NOTE — ED Triage Notes (Signed)
Pt c/o right lower back pain since Saturday. Has very mild burning when urinating

## 2023-06-03 NOTE — L&D Delivery Note (Signed)
 OB/GYN Delivery Note  Nadalie Krystall Kruckenberg is a 33 y.o. [id number] s/p SVD at [redacted]w[redacted]d. She was admitted for SOL.   ROM: 1h 20m with thick meconium fluid GBS Status: positive Maximum Maternal Temperature: 98.6  Labor Progress: Admitted in active labor with bulging bag Augmented with AROM  Progressed to complete  Delivery Date/Time: 05/10/24 1221 Delivery: Called to room and patient was complete and pushing. Head delivered right occiput anterior. nuchal cord absent. Shoulder and body delivered in usual fashion. Infant with spontaneous cry, placed on mother's abdomen, dried and stimulated. Cord clamped x 2 after 1-minute delay, and cut by patient's mother. Cord blood drawn. Placenta delivered spontaneously with gentle cord traction. Fundus firm with massage and Pitocin . Labia, perineum, vagina, and cervix inspected and no lacerations were found.  Pt initially hemostatic and with firm fundus.  Several minutes after delivery of placenta she was noted to have persistent uterine bleeding.  A manual sweep was performed revealing a small retained fragment of placenta and membranes which were removed.  MD Dunn called to bedside and confirmed via bedside US  that no further placental fragments remained.  Will treat with 2g ancef  for manual sweep.  Placenta:  spontaneous and manual removal (see above) Complications:none Lacerations: none EBL: 700ml Analgesia: Epidural  Newborn Data: Birth date:05/10/2024 Birth time:12:21 PM Gender:Female Living status:Living Apgars:8 ,9  Weight:            Paralee JONELLE Carpen, MD Family Medicine PGY-1  05/10/2024 12:57 PM

## 2023-06-12 DIAGNOSIS — Z1231 Encounter for screening mammogram for malignant neoplasm of breast: Secondary | ICD-10-CM

## 2023-09-21 ENCOUNTER — Telehealth: Payer: Self-pay

## 2023-09-21 MED ORDER — ONDANSETRON 4 MG PO TBDP: 4.0000 mg | ORAL_TABLET | Freq: Four times a day (QID) | ORAL | 3 refills | Status: DC | PRN

## 2023-09-21 NOTE — Telephone Encounter (Signed)
 Called patient with an interpretor and scheduled her New OB intake visit. Confirmed the appointment information with the patient.

## 2023-09-29 MED ORDER — FAMOTIDINE 20 MG PO TABS: 20.0000 mg | ORAL_TABLET | Freq: Two times a day (BID) | ORAL | 3 refills | Status: DC

## 2023-09-29 NOTE — Addendum Note (Signed)
 Addended by: Malka Sea on: 09/29/2023 10:49 AM   Modules accepted: Orders

## 2023-10-05 ENCOUNTER — Ambulatory Visit

## 2023-10-05 ENCOUNTER — Other Ambulatory Visit: Payer: Self-pay

## 2023-10-05 VITALS — BP 106/72 | HR 95 | Wt 147.0 lb

## 2023-10-05 DIAGNOSIS — Z3A09 9 weeks gestation of pregnancy: Secondary | ICD-10-CM

## 2023-10-05 DIAGNOSIS — Z3480 Encounter for supervision of other normal pregnancy, unspecified trimester: Secondary | ICD-10-CM | POA: Insufficient documentation

## 2023-10-05 DIAGNOSIS — Z3481 Encounter for supervision of other normal pregnancy, first trimester: Secondary | ICD-10-CM | POA: Diagnosis not present

## 2023-10-05 NOTE — Progress Notes (Signed)
 New OB Intake  I explained I am completing New OB Intake today. We discussed her EDD of 05/04/24 that is based on LMP. LMP of 07/29/23. Pt is G5/P2. I reviewed her allergies, medications, Medical/Surgical/OB history, and appropriate screenings.   Patient Active Problem List   Diagnosis Date Noted   Supervision of normal intrauterine pregnancy in multigravida in first trimester 10/05/2023   Normal labor 12/04/2021   GBS (group B Streptococcus carrier), +RV culture, currently pregnant 11/11/2021   Supervision of high risk pregnancy, antepartum 04/30/2021   History of cesarean section 04/30/2021   Language barrier 06/21/2019   History of recurrent miscarriages 05/14/2019    Concerns addressed today  Delivery Plans:  Plans to deliver at [facility].   MyChart/Babyscripts MyChart access verified. I explained pt will have some visits in office and some virtually. Babyscripts app discussed and ordered.   Blood Pressure Cuff  BP cuff  will be provided .  Discussed to be used for virtual visits and or if needed BP checks weekly.    Anatomy US  Explained first scheduled US  will be around 19 weeks.   Labs Discussed [facility] [place] genetic screening with patient. Would like both Panorama and Horizon drawn at new OB visit. Routine prenatal labs needed.   Placed OB Box on problem list and updated   Patient informed that the ultrasound is considered a limited obstetric ultrasound and is not intended to be a complete ultrasound exam.  Patient also informed that the ultrasound is not being completed with the intent of assessing for fetal or placental anomalies or any pelvic abnormalities. Explained that the purpose of today's ultrasound is to assess for dating and fetal heart rate.  Patient acknowledges the purpose of the exam and the limitations of the study.      First visit review I reviewed new OB appt with pt. I explained she will have a pelvic exam, ob bloodwork with genetic screening, and PAP smear.  Explained pt will be seen by Dr. Cathyann Cobia at first visit.    Arrie Lares, [place] 10/05/2023  12:51 PM

## 2023-10-07 LAB — CULTURE, OB URINE

## 2023-10-07 LAB — URINE CULTURE, OB REFLEX

## 2023-10-08 ENCOUNTER — Ambulatory Visit

## 2023-10-08 ENCOUNTER — Other Ambulatory Visit: Payer: Self-pay

## 2023-10-08 DIAGNOSIS — Z3A09 9 weeks gestation of pregnancy: Secondary | ICD-10-CM

## 2023-10-08 DIAGNOSIS — Z3481 Encounter for supervision of other normal pregnancy, first trimester: Secondary | ICD-10-CM | POA: Diagnosis not present

## 2023-10-08 MED ORDER — PROMETHAZINE HCL 25 MG PO TABS: 25.0000 mg | ORAL_TABLET | Freq: Every evening | ORAL | 2 refills | Status: DC | PRN

## 2023-10-08 NOTE — Addendum Note (Signed)
 Addended by: Malka Sea on: 10/08/2023 04:56 PM   Modules accepted: Orders

## 2023-10-09 LAB — CBC/D/PLT+RPR+RH+ABO+RUBIGG...
Antibody Screen: NEGATIVE
Basophils Absolute: 0.1 10*3/uL (ref 0.0–0.2)
Basos: 1 %
EOS (ABSOLUTE): 0.1 10*3/uL (ref 0.0–0.4)
Eos: 1 %
HCV Ab: NONREACTIVE
HIV Screen 4th Generation wRfx: NONREACTIVE
Hematocrit: 37 % (ref 34.0–46.6)
Hemoglobin: 12.3 g/dL (ref 11.1–15.9)
Hepatitis B Surface Ag: NEGATIVE
Immature Grans (Abs): 0.1 10*3/uL (ref 0.0–0.1)
Immature Granulocytes: 1 %
Lymphocytes Absolute: 1.4 10*3/uL (ref 0.7–3.1)
Lymphs: 13 %
MCH: 29.6 pg (ref 26.6–33.0)
MCHC: 33.2 g/dL (ref 31.5–35.7)
MCV: 89 fL (ref 79–97)
Monocytes Absolute: 0.6 10*3/uL (ref 0.1–0.9)
Monocytes: 5 %
Neutrophils Absolute: 8.7 10*3/uL — ABNORMAL HIGH (ref 1.4–7.0)
Neutrophils: 79 %
Platelets: 299 10*3/uL (ref 150–450)
RBC: 4.15 x10E6/uL (ref 3.77–5.28)
RDW: 13.2 % (ref 11.7–15.4)
RPR Ser Ql: NONREACTIVE
Rh Factor: POSITIVE
Rubella Antibodies, IGG: 9.35 {index} (ref >0.99)
WBC: 10.9 10*3/uL — ABNORMAL HIGH (ref 3.4–10.8)

## 2023-10-09 LAB — HCV INTERPRETATION

## 2023-10-12 ENCOUNTER — Encounter: Payer: Self-pay | Admitting: Obstetrics and Gynecology

## 2023-11-05 ENCOUNTER — Ambulatory Visit: Admitting: Family Medicine

## 2023-11-05 VITALS — BP 108/70 | HR 78 | Wt 151.0 lb

## 2023-11-05 DIAGNOSIS — Z3481 Encounter for supervision of other normal pregnancy, first trimester: Secondary | ICD-10-CM

## 2023-11-05 DIAGNOSIS — Z758 Other problems related to medical facilities and other health care: Secondary | ICD-10-CM

## 2023-11-05 DIAGNOSIS — Z3A14 14 weeks gestation of pregnancy: Secondary | ICD-10-CM

## 2023-11-05 DIAGNOSIS — Z603 Acculturation difficulty: Secondary | ICD-10-CM

## 2023-11-05 DIAGNOSIS — Z98891 History of uterine scar from previous surgery: Secondary | ICD-10-CM | POA: Diagnosis not present

## 2023-11-05 DIAGNOSIS — Z1331 Encounter for screening for depression: Secondary | ICD-10-CM

## 2023-11-06 LAB — HEMOGLOBIN A1C
Est. average glucose Bld gHb Est-mCnc: 94 mg/dL
Hgb A1c MFr Bld: 4.9 % (ref 4.8–5.6)

## 2023-11-06 MED ORDER — GLYCOPYRROLATE 2 MG PO TABS: 2.0000 mg | ORAL_TABLET | Freq: Three times a day (TID) | ORAL | 3 refills | Status: DC | PRN

## 2023-11-06 NOTE — Progress Notes (Signed)
 Subjective:  Ashley Payne is a [id number] [redacted]w[redacted]d being seen today for her first obstetrical visit.  Her obstetrical history is significant for 2 prior vaginal deliveries. Patient does intend to breast feed. Pregnancy history fully reviewed.  Patient reports nausea and vomiting - improving to some degree. Has a lot of spitting and a lot of saliva.  BP 108/70   Pulse 78   Wt 151 lb (68.5 kg)   LMP 07/29/2023   BMI 26.75 kg/m   HISTORY: OB History  Gravida Para Term Preterm AB Living  5 2 2  2 2   SAB IAB Ectopic Multiple Live Births  2   0 2    # Outcome Date GA Lbr Len/2nd Weight Sex Type Anes PTL Lv  5 Current           4 Term 12/05/21 [redacted]w[redacted]d 17:07 / 03:20 8 lb 2.2 oz (3.69 kg) M VBAC EPI  LIV     Birth Comments: WNL  3 Term 03/30/20 [redacted]w[redacted]d    CS-LTranv EPI N LIV  2 SAB 04/22/19 [redacted]w[redacted]d         1 SAB 2018 [redacted]w[redacted]d           Past Medical History:  Diagnosis Date   Medical history non-contributory     Past Surgical History:  Procedure Laterality Date   CESAREAN SECTION  2021   NO PAST SURGERIES     TUBAL LIGATION Bilateral 12/05/2021   Procedure: POST PARTUM TUBAL LIGATION;  Surgeon: Ozan, Jennifer, DO;  [facility]: [facility];  Service: Gynecology;  Laterality: Bilateral;    Family History  Problem Relation Age of Onset   Healthy Mother    Healthy Father      Exam  BP 108/70   Pulse 78   Wt 151 lb (68.5 kg)   LMP 07/29/2023   BMI 26.75 kg/m   Chaperone present during exam  CONSTITUTIONAL: Well-developed, well-nourished female in no acute distress.  HENT:  Normocephalic, atraumatic, External right and left ear normal. Oropharynx is clear and moist EYES: Conjunctivae and EOM are normal. Pupils are equal, round, and reactive to light. No scleral icterus.  NECK: Normal range of motion, supple, no masses.  Normal thyroid.  CARDIOVASCULAR: Normal heart rate noted, regular rhythm RESPIRATORY: Clear to auscultation bilaterally. Effort and breath sounds normal, no  problems with respiration noted. BREASTS: deferred ABDOMEN: Soft, normal bowel sounds, no distention noted.  No tenderness, rebound or guarding.  PELVIC: deferred MUSCULOSKELETAL: Normal range of motion. No tenderness.  No cyanosis, clubbing, or edema.  2+ distal pulses. SKIN: Skin is warm and dry. No rash noted. Not diaphoretic. No erythema. No pallor. NEUROLOGIC: Alert and oriented to person, place, and time. Normal reflexes, muscle tone coordination. No cranial nerve deficit noted. PSYCHIATRIC: Normal mood and affect. Normal behavior. Normal judgment and thought content.    Assessment:    Pregnancy: [id number] Patient Active Problem List   Diagnosis Date Noted   Supervision of normal intrauterine pregnancy in multigravida in first trimester 10/05/2023   Normal labor 12/04/2021   GBS (group B Streptococcus carrier), +RV culture, currently pregnant 11/11/2021   Supervision of high risk pregnancy, antepartum 04/30/2021   History of cesarean section 04/30/2021   Language barrier 06/21/2019   History of recurrent miscarriages 05/14/2019      Plan:   1. [redacted] weeks gestation of pregnancy (Primary) - PANORAMA PRENATAL TEST - Hemoglobin A1c - US  MFM OB DETAIL +14 WK; Future  2. Supervision of normal intrauterine pregnancy in multigravida  in first trimester Will trial Robinul to see if this is helpful with nausea. Can split phenergan  to help decrease nausea.  3. History of cesarean section - US  MFM OB DETAIL +14 WK; Future  4. Language barrier Interpreter used    Initial labs obtained Continue prenatal vitamins Reviewed n/v relief measures and warning s/s to report Reviewed recommended weight gain based on pre-gravid BMI Encouraged well-balanced diet Genetic & carrier screening discussed: requests Panorama,  Ultrasound discussed; fetal survey: requested CCNC completed> form faxed if has or is planning to apply for medicaid The nature of [facility] [employer]  with multiple MDs and other Advanced Practice Providers was explained to patient; also emphasized that fellows, residents, and students are part of our team.    Problem list reviewed and updated. 75% of 30 min visit spent on counseling and coordination of care.     Zaryah Seckel J Ladonya Jerkins 11/06/2023

## 2023-11-11 ENCOUNTER — Ambulatory Visit: Payer: Self-pay | Admitting: Family Medicine

## 2023-11-11 DIAGNOSIS — Z3481 Encounter for supervision of other normal pregnancy, first trimester: Secondary | ICD-10-CM

## 2023-11-11 LAB — PANORAMA PRENATAL TEST FULL PANEL:PANORAMA TEST PLUS 5 ADDITIONAL MICRODELETIONS: FETAL FRACTION: 10.1

## 2023-12-01 ENCOUNTER — Encounter: Payer: Self-pay | Admitting: Family Medicine

## 2023-12-02 ENCOUNTER — Telehealth: Payer: Self-pay

## 2023-12-02 NOTE — Telephone Encounter (Signed)
 Patient sent Mychart message requesting today instead of tomorrow for vaginal irritation.   Called patient with [facility] interpreter Unice [phone]. Informed patient that we do not have any appointments available today. Patient states she will wait to be seen tomorrow. Understanding was voiced. Kahil Agner l Kirin Pastorino, CMA

## 2023-12-03 ENCOUNTER — Other Ambulatory Visit (HOSPITAL_COMMUNITY)
Admission: RE | Admit: 2023-12-03 | Discharge: 2023-12-03 | Disposition: A | Discharge status: Home / Self Care | Source: Ambulatory Visit | Attending: Family Medicine | Admitting: Family Medicine

## 2023-12-03 ENCOUNTER — Ambulatory Visit: Admitting: Family Medicine

## 2023-12-03 VITALS — BP 104/63 | HR 101 | Wt 155.0 lb

## 2023-12-03 DIAGNOSIS — O99820 Streptococcus B carrier state complicating pregnancy: Secondary | ICD-10-CM

## 2023-12-03 DIAGNOSIS — Z3A18 18 weeks gestation of pregnancy: Secondary | ICD-10-CM

## 2023-12-03 DIAGNOSIS — N898 Other specified noninflammatory disorders of vagina: Secondary | ICD-10-CM | POA: Insufficient documentation

## 2023-12-03 DIAGNOSIS — O0990 Supervision of high risk pregnancy, unspecified, unspecified trimester: Secondary | ICD-10-CM

## 2023-12-03 DIAGNOSIS — Z758 Other problems related to medical facilities and other health care: Secondary | ICD-10-CM

## 2023-12-03 DIAGNOSIS — Z98891 History of uterine scar from previous surgery: Secondary | ICD-10-CM

## 2023-12-03 DIAGNOSIS — Z603 Acculturation difficulty: Secondary | ICD-10-CM

## 2023-12-03 MED ORDER — ONDANSETRON HCL 4 MG PO TABS: 4.0000 mg | ORAL_TABLET | Freq: Three times a day (TID) | ORAL | 3 refills | Status: DC | PRN

## 2023-12-03 NOTE — Progress Notes (Signed)
[facility] interpreter Marta. 

## 2023-12-03 NOTE — Progress Notes (Signed)
   PRENATAL VISIT NOTE  Subjective:  Ashley Payne is a 33 y.o. [phone] at [redacted]w[redacted]d being seen today for ongoing prenatal care.  She is currently monitored for the following issues for this high-risk pregnancy and has History of recurrent miscarriages; Language barrier; History of cesarean section; GBS (group B Streptococcus carrier), +RV culture, currently pregnant; Normal labor; and Supervision of normal intrauterine pregnancy in multigravida in first trimester on their problem list.  Patient reports vaginal irritation x1week.  Contractions: Not present. Vag. Bleeding: None.  Movement: Present. Denies leaking of fluid.   The following portions of the patient's history were reviewed and updated as appropriate: allergies, current medications, past family history, past medical history, past social history, past surgical history and problem list.   Objective:    Vitals:   12/03/23 1353  BP: 104/63  Pulse: (!) 101  Weight: 155 lb (70.3 kg)    Fetal Status:  Fetal Heart Rate (bpm): 154   Movement: Present    General: Alert, oriented and cooperative. Patient is in no acute distress.  Skin: Skin is warm and dry. No rash noted.   Cardiovascular: Normal heart rate noted  Respiratory: Normal respiratory effort, no problems with respiration noted  Abdomen: Soft, gravid, appropriate for gestational age.  Pain/Pressure: Absent     Pelvic: Cervical exam performed in the presence of a chaperone        Extremities: Normal range of motion.  Edema: None  Mental Status: Normal mood and affect. Normal behavior. Normal judgment and thought content.   Assessment and Plan:  Pregnancy: [id number] at [redacted]w[redacted]d 1. [redacted] weeks gestation of pregnancy (Primary)  2. Supervision of high risk pregnancy, antepartum FHT normal - ondansetron  (ZOFRAN ) 4 MG tablet; Take 1 tablet (4 mg total) by mouth every 8 (eight) hours as needed for nausea or vomiting.  Dispense: 60 tablet; Refill: 3  3. Vaginal discharge Culture  obtained - Cervicovaginal ancillary only( [place])  4. GBS (group B Streptococcus carrier), +RV culture, currently pregnant Intrapartum ppx  5. History of cesarean section Desires TOLAC Successful VBAC  6. Language barrier Interpreter used.  Preterm labor symptoms and general obstetric precautions including but not limited to vaginal bleeding, contractions, leaking of fluid and fetal movement were reviewed in detail with the patient. Please refer to After Visit Summary for other counseling recommendations.   No follow-ups on file.  Future Appointments  Date Time Provider Department Center  12/31/2023  2:10 PM Audreanna Torrisi J, DO [facility] None    Ashley Swanton J Letesha Klecker, DO

## 2023-12-04 ENCOUNTER — Inpatient Hospital Stay (HOSPITAL_COMMUNITY)
Admission: AD | Admit: 2023-12-04 | Discharge: 2023-12-04 | Disposition: A | Discharge status: Home / Self Care | Attending: Obstetrics & Gynecology | Admitting: Obstetrics & Gynecology

## 2023-12-04 ENCOUNTER — Encounter (HOSPITAL_COMMUNITY): Payer: Self-pay | Admitting: Obstetrics & Gynecology

## 2023-12-04 DIAGNOSIS — W19XXXA Unspecified fall, initial encounter: Secondary | ICD-10-CM

## 2023-12-04 DIAGNOSIS — M25561 Pain in right knee: Secondary | ICD-10-CM | POA: Diagnosis present

## 2023-12-04 DIAGNOSIS — Z3A18 18 weeks gestation of pregnancy: Secondary | ICD-10-CM | POA: Diagnosis not present

## 2023-12-04 DIAGNOSIS — R102 Pelvic and perineal pain: Secondary | ICD-10-CM | POA: Diagnosis present

## 2023-12-04 DIAGNOSIS — O26892 Other specified pregnancy related conditions, second trimester: Secondary | ICD-10-CM | POA: Diagnosis not present

## 2023-12-04 DIAGNOSIS — M25551 Pain in right hip: Secondary | ICD-10-CM | POA: Diagnosis not present

## 2023-12-04 DIAGNOSIS — M549 Dorsalgia, unspecified: Secondary | ICD-10-CM | POA: Diagnosis present

## 2023-12-04 DIAGNOSIS — O9A212 Injury, poisoning and certain other consequences of external causes complicating pregnancy, second trimester: Secondary | ICD-10-CM | POA: Diagnosis not present

## 2023-12-04 DIAGNOSIS — S3991XA Unspecified injury of abdomen, initial encounter: Secondary | ICD-10-CM

## 2023-12-04 DIAGNOSIS — Z309 Encounter for contraceptive management, unspecified: Secondary | ICD-10-CM | POA: Insufficient documentation

## 2023-12-04 DIAGNOSIS — W010XXA Fall on same level from slipping, tripping and stumbling without subsequent striking against object, initial encounter: Secondary | ICD-10-CM | POA: Diagnosis not present

## 2023-12-04 DIAGNOSIS — M5459 Other low back pain: Secondary | ICD-10-CM | POA: Insufficient documentation

## 2023-12-04 NOTE — Discharge Instructions (Signed)
 Razones para Programme researcher, broadcasting/film/video a MAU/[facility]: Si tienes un sangrado ms abundante que empapa ms de 2 compresas por hora durante una hora o ms Si sangras tanto que sientes que podras desmayarte o te desmayas Si tienes un dolor abdominal significativo que no mejora con Tylenol  1000 mg cada 8 horas segn sea necesario para el dolor Si desarrollas fiebre > 100.5  Las contracciones son cada 5 minutos o menos, cada uno de los ltimos 1 minuto, stos han llevado a cabo durante 1-2 horas, y no se Hydrographic surveyor o hablar durante ellas Usted tiene un gran chorro de lquido o un goteo de lquido que no se detiene y hay que usar una toalla Ha de sangrado que es de color rojo brillante, denso que el manchado - como sangrado menstrual (manchado puede ser normal en trabajo de parto prematuro o despus de una comprobacin de su cuello uterino) Usted no se siente el beb se mueve como l / ella hace normalmente

## 2023-12-04 NOTE — MAU Note (Signed)
 Ashley Payne is a 33 y.o. at [redacted]w[redacted]d here in MAU reporting: fell on her right knee and side. Reports some back pain and some mild cramping in her pelvis. Denies any vag bleeding or leaking good fetal movement reported/   LMP:  Onset of complaint: 1 hr.  Pain score: 4-5 Vitals:   12/04/23 2002  BP: (!) 116/56  Pulse: 93  Resp: 18  Temp: 99.2 F (37.3 C)     FHT: 154  Lab orders placed from triage: u/a

## 2023-12-04 NOTE — MAU Provider Note (Cosign Needed Addendum)
 Chief Complaint:  Fall   None    HPI: Ashley Payne is a 33 y.o. [phone] at 79w2dwho presents to maternity admissions reporting falling at home after tripping over something Landed on right hip and knee.  No difficulty walking.  Has some soreness over right hip and lower back.  Has been feeling baby move. . She reports good fetal movement, denies LOF, vaginal bleeding, dizziness, n/v, or fever/chills.   Fall The accident occurred 1 to 3 hours ago. The fall occurred while walking. She landed on [place]. There was no blood loss. The point of impact was the right hip and right knee. The pain is present in the right hip and right knee. Pertinent negatives include no abdominal pain or loss of consciousness. She has tried nothing for the symptoms.   RN Note: Ashley Payne is a 33 y.o. at [redacted]w[redacted]d here in MAU reporting: fell on her right knee and side. Reports some back pain and some mild cramping in her pelvis. Denies any vag bleeding or leaking good fetal movement reported/   Onset of complaint: 1 hr.  Pain score: 4-5  Past Medical History: Past Medical History:  Diagnosis Date   Medical history non-contributory     Past obstetric history: OB History  Gravida Para Term Preterm AB Living  5 2 2  2 2   SAB IAB Ectopic Multiple Live Births  2   0 2    # Outcome Date GA Lbr Len/2nd Weight Sex Type Anes PTL Lv  5 Current           4 Term 12/05/21 [redacted]w[redacted]d 17:07 / 03:20 3690 g M VBAC EPI  LIV     Birth Comments: WNL  3 Term 03/30/20 [redacted]w[redacted]d    CS-LTranv EPI N LIV  2 SAB 04/22/19 [redacted]w[redacted]d         1 SAB 2018 [redacted]w[redacted]d           Past Surgical History: Decided NOT to proceed with BTL on 12/05/21.  Disregard Tubal Ligation below. Past Surgical History:  Procedure Laterality Date   CESAREAN SECTION  2021   NO PAST SURGERIES     TUBAL LIGATION Bilateral 12/05/2021   Procedure: POST PARTUM TUBAL LIGATION;  Surgeon: Ozan, Jennifer, DO;  Location: [facility];  Service: Gynecology;  Laterality:  Bilateral;    Family History: Family History  Problem Relation Age of Onset   Healthy Mother    Healthy Father     Social History: Social History   Tobacco Use   Smoking status: Never   Smokeless tobacco: Never  Vaping Use   Vaping status: Never Used  Substance Use Topics   Alcohol use: No   Drug use: Never    Allergies: No Known Allergies  Meds:  Medications Prior to Admission  Medication Sig Dispense Refill Last Dose/Taking   cyclobenzaprine  (FLEXERIL ) 5 MG tablet Take 1 tablet (5 mg total) by mouth 3 (three) times daily as needed for muscle spasms. (Patient not taking: Reported on 12/03/2023) 30 tablet 0    diclofenac  Sodium (VOLTAREN ) 1 % GEL Apply 4 g topically 4 (four) times daily. (Patient not taking: Reported on 12/03/2023) 200 g 1    famotidine  (PEPCID ) 20 MG tablet Take 1 tablet (20 mg total) by mouth 2 (two) times daily. 120 tablet 3    glycopyrrolate  (ROBINUL ) 2 MG tablet Take 1 tablet (2 mg total) by mouth 3 (three) times daily as needed. 14 tablet 3    ondansetron  (ZOFRAN ) 4 MG  tablet Take 1 tablet (4 mg total) by mouth every 8 (eight) hours as needed for nausea or vomiting. 60 tablet 3    ondansetron  (ZOFRAN -ODT) 4 MG disintegrating tablet Take 1 tablet (4 mg total) by mouth every 6 (six) hours as needed for nausea. 40 tablet 3    Prenatal Vit-Fe Fumarate-FA (PRENATAL VITAMINS PO) Take 1 capsule by mouth daily.      promethazine  (PHENERGAN ) 25 MG tablet Take 1 tablet (25 mg total) by mouth at bedtime as needed for nausea or vomiting. 30 tablet 2     I have reviewed patient's Past Medical Hx, Surgical Hx, Family Hx, Social Hx, medications and allergies.   ROS:  Review of Systems  Gastrointestinal:  Negative for abdominal pain.  Neurological:  Negative for loss of consciousness.   Other systems negative  Physical Exam  Patient Vitals for the past 24 hrs:  BP Temp Pulse Resp  12/04/23 2002 (!) 116/56 99.2 F (37.3 C) 93 18   Constitutional: Well-developed,  well-nourished female in no acute distress.  Cardiovascular: normal rate  Respiratory: normal effort GI: Abd soft, non-tender, gravid appropriate for gestational age.   No rebound or guarding. MS: Extremities nontender, no edema, normal ROM Neurologic: Alert and oriented x 4.   PELVIC EXAM: deferred   FHT:  154   Labs: No results found for this or any previous visit (from the past 24 hours). O/Positive/-- (05/08 1618)  Imaging:  Pt informed that the ultrasound is considered a limited OB ultrasound and is not intended to be a complete ultrasound exam.  Patient also informed that the ultrasound is not being completed with the intent of assessing for fetal or placental anomalies or any pelvic abnormalities.  Explained that the purpose of today's ultrasound is to assess for fetal well-being.  Patient acknowledges the purpose of the exam and the limitations of the study.    Bedside US  done for reassurance to assess for fetal well-being. Fetus is in the vertex position FHR regular.  Small amount of fetal movement Anterior placenta Normal appearing amniotic fluid   MAU Course/MDM: I have reviewed the triage vital signs and the nursing notes.   Pertinent labs & imaging results that were available during my care of the patient were reviewed by me and considered in my medical decision making (see chart for details).      I have reviewed her medical records including past results, notes and treatments.   I have ordered labs and reviewed results.  NST reviewed Treatments in MAU included bedside US .    Assessment: 1. Blunt trauma to abdomen, initial encounter   2. [redacted] weeks gestation of pregnancy   3.    Fall   Plan: Discharge home Discussed use of ice on areas of sorness/tenderness. May take Tylenol  or Flexeril  (has previous Rx) as needed for pain. Encouraged to return if she develops worsening of symptoms, increase in pain, fever, or other concerning symptoms.     Follow-up  Information     [facility] Follow up.   Specialty: Obstetrics and Gynecology Why: As scheduled for ongoing prenatal care Contact information: [address] [place]  [address] [phone]                Pt stable at time of discharge.  Earnie Pouch CNM, MSN Certified Nurse-Midwife 12/04/2023 8:31 PM

## 2023-12-07 ENCOUNTER — Ambulatory Visit: Payer: Self-pay | Admitting: Family Medicine

## 2023-12-07 LAB — CERVICOVAGINAL ANCILLARY ONLY
Bacterial Vaginitis (gardnerella): NEGATIVE
Candida Glabrata: NEGATIVE
Candida Vaginitis: POSITIVE — AB
Comment: NEGATIVE
Comment: NEGATIVE
Comment: NEGATIVE

## 2023-12-09 MED ORDER — FLUCONAZOLE 150 MG PO TABS: 150.0000 mg | ORAL_TABLET | Freq: Once | ORAL | 0 refills | Status: AC

## 2023-12-14 MED ORDER — MICONAZOLE NITRATE 2 % VA CREA: 1.0000 | TOPICAL_CREAM | Freq: Every day | VAGINAL | 2 refills | Status: DC

## 2023-12-14 NOTE — Addendum Note (Signed)
 Addended by: BARBRA LANG PARAS on: 12/14/2023 09:29 AM   Modules accepted: Orders

## 2023-12-31 ENCOUNTER — Ambulatory Visit: Admitting: Family Medicine

## 2023-12-31 ENCOUNTER — Encounter: Payer: Self-pay | Admitting: Family Medicine

## 2023-12-31 VITALS — BP 101/57 | HR 86 | Wt 161.1 lb

## 2023-12-31 DIAGNOSIS — O26812 Pregnancy related exhaustion and fatigue, second trimester: Secondary | ICD-10-CM

## 2023-12-31 DIAGNOSIS — Z758 Other problems related to medical facilities and other health care: Secondary | ICD-10-CM

## 2023-12-31 DIAGNOSIS — Z3A21 21 weeks gestation of pregnancy: Secondary | ICD-10-CM | POA: Diagnosis not present

## 2023-12-31 DIAGNOSIS — O99820 Streptococcus B carrier state complicating pregnancy: Secondary | ICD-10-CM

## 2023-12-31 DIAGNOSIS — Z603 Acculturation difficulty: Secondary | ICD-10-CM

## 2023-12-31 DIAGNOSIS — Z98891 History of uterine scar from previous surgery: Secondary | ICD-10-CM

## 2023-12-31 DIAGNOSIS — Z3481 Encounter for supervision of other normal pregnancy, first trimester: Secondary | ICD-10-CM

## 2023-12-31 NOTE — Progress Notes (Signed)
   PRENATAL VISIT NOTE  Subjective:  Ashley Payne is a 33 y.o. [phone] at [redacted]w[redacted]d being seen today for ongoing prenatal care.  She is currently monitored for the following issues for this low-risk pregnancy and has History of recurrent miscarriages; Language barrier; History of cesarean section; GBS (group B Streptococcus carrier), +RV culture, currently pregnant; Supervision of normal intrauterine pregnancy in multigravida in first trimester; and Contraception management on their problem list.  Patient reports fatigue.  Contractions: Not present. Vag. Bleeding: None.  Movement: Present. Denies leaking of fluid.   The following portions of the patient's history were reviewed and updated as appropriate: allergies, current medications, past family history, past medical history, past social history, past surgical history and problem list.   Objective:    Vitals:   12/31/23 1424  BP: (!) 101/57  Pulse: 86  Weight: 161 lb 1.3 oz (73.1 kg)    Fetal Status:  Fetal Heart Rate (bpm): 168   Movement: Present    General: Alert, oriented and cooperative. Patient is in no acute distress.  Skin: Skin is warm and dry. No rash noted.   Cardiovascular: Normal heart rate noted  Respiratory: Normal respiratory effort, no problems with respiration noted  Abdomen: Soft, gravid, appropriate for gestational age.  Pain/Pressure: Present     Pelvic: Cervical exam deferred        Extremities: Normal range of motion.  Edema: None  Mental Status: Normal mood and affect. Normal behavior. Normal judgment and thought content.   Assessment and Plan:  Pregnancy: [id number] at [redacted]w[redacted]d 1. [redacted] weeks gestation of pregnancy (Primary) - AFP, Serum, Open Spina Bifida  2. Supervision of normal intrauterine pregnancy in multigravida in first trimester FHT normal  3. History of cesarean section S/p VBAC  4. Language barrier Interpreter used  5. GBS (group B Streptococcus carrier), +RV culture, currently  pregnant Intrapartum ppx  6. Pregnancy related fatigue in second trimester - TSH Rfx on Abnormal to Free T4 - Comp Met (CMET) - CBC  Preterm labor symptoms and general obstetric precautions including but not limited to vaginal bleeding, contractions, leaking of fluid and fetal movement were reviewed in detail with the patient. Please refer to After Visit Summary for other counseling recommendations.   No follow-ups on file.  Future Appointments  Date Time Provider Department Center  01/11/2024  8:00 AM [facility] PROVIDER 1 [facility]  01/11/2024  8:30 AM [facility] US3 [facility]    Lang JINNY Peel, DO

## 2024-01-04 NOTE — Addendum Note (Signed)
 Addended by: BARBRA LANG PARAS on: 01/04/2024 04:41 PM   Modules accepted: Orders

## 2024-01-06 LAB — CBC
Hematocrit: 36.4 % (ref 34.0–46.6)
Hemoglobin: 11.7 g/dL (ref 11.1–15.9)
MCH: 30.6 pg (ref 26.6–33.0)
MCHC: 32.1 g/dL (ref 31.5–35.7)
MCV: 95 fL (ref 79–97)
Platelets: 251 x10E3/uL (ref 150–450)
RBC: 3.82 x10E6/uL (ref 3.77–5.28)
RDW: 12.8 % (ref 11.7–15.4)
WBC: 13.8 x10E3/uL — ABNORMAL HIGH (ref 3.4–10.8)

## 2024-01-06 LAB — AFP, SERUM, OPEN SPINA BIFIDA
AFP MoM: 1.05
AFP Value: 64.6 ng/mL
Gest. Age on Collection Date: 21 wk
Maternal Age At EDD: 33.6 a
OSBR Risk 1 IN: 10000
Test Results:: NEGATIVE
Weight: 161 [lb_av]

## 2024-01-06 LAB — COMPREHENSIVE METABOLIC PANEL WITH GFR
ALT: 7 IU/L (ref 0–32)
AST: 13 IU/L (ref 0–40)
Albumin: 3.7 g/dL — ABNORMAL LOW (ref 3.9–4.9)
Alkaline Phosphatase: 61 IU/L (ref 44–121)
BUN/Creatinine Ratio: 16 (ref 9–23)
BUN: 8 mg/dL (ref 6–20)
Bilirubin Total: 0.2 mg/dL (ref 0.0–1.2)
CO2: 20 mmol/L (ref 20–29)
Calcium: 9.3 mg/dL (ref 8.7–10.2)
Chloride: 99 mmol/L (ref 96–106)
Creatinine, Ser: 0.5 mg/dL — ABNORMAL LOW (ref 0.57–1.00)
Globulin, Total: 2.9 g/dL (ref 1.5–4.5)
Glucose: 74 mg/dL (ref 70–99)
Potassium: 4.2 mmol/L (ref 3.5–5.2)
Sodium: 134 mmol/L (ref 134–144)
Total Protein: 6.6 g/dL (ref 6.0–8.5)
eGFR: 127 mL/min/1.73 (ref >59)

## 2024-01-06 LAB — TSH RFX ON ABNORMAL TO FREE T4: TSH: 1.83 u[IU]/mL (ref 0.450–4.500)

## 2024-01-08 ENCOUNTER — Ambulatory Visit: Payer: Self-pay | Admitting: Family Medicine

## 2024-01-08 DIAGNOSIS — Z3481 Encounter for supervision of other normal pregnancy, first trimester: Secondary | ICD-10-CM

## 2024-01-11 ENCOUNTER — Ambulatory Visit

## 2024-01-11 ENCOUNTER — Other Ambulatory Visit

## 2024-01-12 ENCOUNTER — Ambulatory Visit: Attending: Obstetrics and Gynecology | Admitting: Maternal & Fetal Medicine

## 2024-01-12 ENCOUNTER — Ambulatory Visit (HOSPITAL_BASED_OUTPATIENT_CLINIC_OR_DEPARTMENT_OTHER)

## 2024-01-12 VITALS — BP 107/63

## 2024-01-12 DIAGNOSIS — O34219 Maternal care for unspecified type scar from previous cesarean delivery: Secondary | ICD-10-CM | POA: Diagnosis not present

## 2024-01-12 DIAGNOSIS — Z3689 Encounter for other specified antenatal screening: Secondary | ICD-10-CM | POA: Diagnosis present

## 2024-01-12 DIAGNOSIS — Z3A22 22 weeks gestation of pregnancy: Secondary | ICD-10-CM | POA: Insufficient documentation

## 2024-01-12 DIAGNOSIS — Z3A14 14 weeks gestation of pregnancy: Secondary | ICD-10-CM | POA: Diagnosis not present

## 2024-01-12 DIAGNOSIS — Z98891 History of uterine scar from previous surgery: Secondary | ICD-10-CM | POA: Diagnosis not present

## 2024-01-12 NOTE — Progress Notes (Signed)
 Patient information  Patient Name: Ashley Payne  Patient MRN:   [id number]  Referring practice: MFM Referring Provider: [facility]  Problem List   Patient Active Problem List   Diagnosis Date Noted   History of VBAC 01/12/2024   Contraception management 12/04/2023   Supervision of normal intrauterine pregnancy in multigravida in first trimester 10/05/2023   GBS (group B Streptococcus carrier), +RV culture, currently pregnant 11/11/2021   History of cesarean section 04/30/2021   Language barrier 06/21/2019   History of recurrent miscarriages 05/14/2019    Maternal Fetal medicine Consult  Ashley Payne is a 33 y.o. [id number] at [redacted]w[redacted]d here for ultrasound and consultation. Ashley Payne is doing well today with no acute concerns. Today we focused on the following:   Hx of CD and TOLAC: The patient has a history of 1 cesarean delivery followed by a VBAC, she desires to attempt vaginal birth again.  The placenta is posterior and away from the prior uterine scar.  The patient had time to ask questions that were answered to her satisfaction.  She verbalized understanding and agrees to proceed with the plan below.  Sonographic findings Single intrauterine pregnancy at 22w 5d. Fetal cardiac activity:  Observed and appears normal. Presentation: Variable. The anatomic structures that were well seen appear normal without evidence of soft markers. The anatomic survey is complete.  Fetal biometry shows the estimated fetal weight at the 60 percentile. Amniotic fluid: Within normal limits.  MVP: 6.05 cm. Placenta: Posterior. Adnexa: No abnormality visualized. Cervical length: 5.3 cm.  There are limitations of prenatal ultrasound such as the inability to detect certain abnormalities due to poor visualization. Various factors such as fetal position, gestational age and maternal body habitus may increase the difficulty in visualizing the fetal anatomy.     Recommendations -EDD should be 05/12/2024 based on  Early Ultrasound  (10/08/23). -No further ultrasounds are recommended at this time based on the current indications. If future indications arise (e.g. size/date discrepancy on fundal height, gestational diabetes or hypertension) and an ultrasound is to be desired at our MFM office, please send a referral.   Review of Systems: A review of systems was performed and was negative except per HPI   Vitals and Physical Exam    01/12/2024    8:09 AM 12/31/2023    2:24 PM 12/04/2023    8:02 PM  Vitals with BMI  Weight  161 lbs 1 oz   Systolic 107 101 883  Diastolic 63 57 56  Pulse  86 93    Sitting comfortably on the sonogram table Nonlabored breathing Normal rate and rhythm Abdomen is nontender  Past pregnancies OB History  Gravida Para Term Preterm AB Living  5 2 2  2 2   SAB IAB Ectopic Multiple Live Births  2   0 2    # Outcome Date GA Lbr Len/2nd Weight Sex Type Anes PTL Lv  5 Current           4 Term 12/05/21 [redacted]w[redacted]d 17:07 / 03:20 8 lb 2.2 oz (3.69 kg) M VBAC EPI  LIV     Birth Comments: WNL  3 Term 03/30/20 [redacted]w[redacted]d    CS-LTranv EPI N LIV  2 SAB 04/22/19 [redacted]w[redacted]d         1 SAB 2018 [redacted]w[redacted]d           I spent 30 minutes reviewing the patients chart, including labs and images as well as counseling the patient about  her medical conditions. Greater than 50% of the time was spent in direct face-to-face patient counseling.  Delora Smaller  MFM, [facility]   01/12/2024  9:33 AM

## 2024-01-19 ENCOUNTER — Inpatient Hospital Stay (HOSPITAL_COMMUNITY)
Admission: AD | Admit: 2024-01-19 | Discharge: 2024-01-19 | Disposition: A | Discharge status: Home / Self Care | Attending: Obstetrics and Gynecology | Admitting: Obstetrics and Gynecology

## 2024-01-19 DIAGNOSIS — O26892 Other specified pregnancy related conditions, second trimester: Secondary | ICD-10-CM

## 2024-01-19 DIAGNOSIS — R0789 Other chest pain: Secondary | ICD-10-CM

## 2024-01-19 DIAGNOSIS — M940 Chondrocostal junction syndrome [Tietze]: Secondary | ICD-10-CM

## 2024-01-19 DIAGNOSIS — Z3A23 23 weeks gestation of pregnancy: Secondary | ICD-10-CM

## 2024-01-19 DIAGNOSIS — O99891 Other specified diseases and conditions complicating pregnancy: Secondary | ICD-10-CM | POA: Diagnosis not present

## 2024-01-19 LAB — URINALYSIS, ROUTINE W REFLEX MICROSCOPIC
Bilirubin Urine: NEGATIVE
Glucose, UA: NEGATIVE mg/dL
Hgb urine dipstick: NEGATIVE
Ketones, ur: NEGATIVE mg/dL
Leukocytes,Ua: NEGATIVE
Nitrite: NEGATIVE
Protein, ur: NEGATIVE mg/dL
Specific Gravity, Urine: 1.003 — ABNORMAL LOW (ref 1.005–1.030)
pH: 6 (ref 5.0–8.0)

## 2024-01-19 MED ORDER — ACETAMINOPHEN 500 MG PO TABS: 500.0000 mg | ORAL_TABLET | Freq: Four times a day (QID) | ORAL | Status: DC | PRN

## 2024-01-19 MED ORDER — CALCIUM CARBONATE ANTACID 500 MG PO CHEW
1.0000 | CHEWABLE_TABLET | Freq: Once | ORAL | Status: AC
  Administered 2024-01-19: 200 mg via ORAL
  Filled 2024-01-19: qty 1

## 2024-01-19 MED ORDER — CALCIUM CARBONATE ANTACID 500 MG PO CHEW: 1.0000 | CHEWABLE_TABLET | Freq: Every day | ORAL | Status: DC

## 2024-01-19 MED ORDER — ACETAMINOPHEN 500 MG PO TABS
1000.0000 mg | ORAL_TABLET | Freq: Once | ORAL | Status: AC
  Administered 2024-01-19: 1000 mg via ORAL
  Filled 2024-01-19: qty 2

## 2024-01-19 NOTE — MAU Note (Signed)
 Ashley Payne is a 33 y.o. at [redacted]w[redacted]d here in MAU reporting pressure in upper L breast like someone is pushing on her. Pressure comes with movement.Pressure is not really painful but annoying.  She wondered if it had anything to do with lactation. She does have some pressure in lower abdomen (points to pubic bone)  LMP: na Onset of complaint: this am Pain score: 0 Vitals:   01/19/24 2027 01/19/24 2029  BP:  118/64  Pulse: 87   Resp: 17   Temp: 99.3 F (37.4 C)   SpO2: 100%      FHT: 145  Lab orders placed from triage: u/a

## 2024-01-19 NOTE — Discharge Instructions (Signed)
 You were seen in the maternity assessment unit for pain in the upper part of your left chest that improved with Tylenol  and Tums.  Your vital signs while you were here in the MAU were within normal limits.  And your exam was reassuring.  We recommend that you go home and monitor your symptoms.  If they worsen or do not improve significantly please return to the maternity assessment unit.

## 2024-01-19 NOTE — MAU Provider Note (Signed)
 History     CSN: [id number]  Arrival date and time: 01/19/24 1950   Event Date/Time   First Provider Initiated Contact with Patient 01/19/24 2054      Chief Complaint  Patient presents with   Chest Pain    Patient is 33 y.o. [id number] [redacted]w[redacted]d here with complaints of left upper chest concern-- describes as pressure but not pain, more like an  annoyance, started today. Non-radiating pain. No change with inspiration and exhalation.  Not reproducible by patient. Worse with movement. Has not tried anything, does not like medications.  Has not tried had any URI symptoms recently.  Of note when speaking to the nurse the patient rated her pain as a 6 out of 10 and again describes the pain as more of an annoyance.  +FM, denies LOF, VB, contractions, vaginal discharge.  Chest Pain  Pertinent negatives include no abdominal pain, back pain, cough, dizziness, fever, nausea, shortness of breath or vomiting.    OB History     Gravida  5   Para  2   Term  2   Preterm      AB  2   Living  2      SAB  2   IAB      Ectopic      Multiple  0   Live Births  2           Past Medical History:  Diagnosis Date   Medical history non-contributory    Did NOT proceed with Tubal Ligation on 12/05/21, Disregard TUBAL LIGATION on Surgical History   Normal labor 12/04/2021    Past Surgical History:  Procedure Laterality Date   CESAREAN SECTION  2021   NO PAST SURGERIES     TUBAL LIGATION Bilateral 12/05/2021   Procedure: POST PARTUM TUBAL LIGATION;  Surgeon: Ozan, Jennifer, DO;  Location: [facility];  Service: Gynecology;  Laterality: Bilateral;    Family History  Problem Relation Age of Onset   Asthma Mother    Healthy Father    Cancer Maternal Grandmother    Diabetes Neg Hx    Heart disease Neg Hx    Hypertension Neg Hx     Social History   Tobacco Use   Smoking status: Never   Smokeless tobacco: Never  Vaping Use   Vaping status: Never Used  Substance Use Topics    Alcohol use: No   Drug use: Never    Allergies: No Known Allergies  Medications Prior to Admission  Medication Sig Dispense Refill Last Dose/Taking   ondansetron  (ZOFRAN -ODT) 4 MG disintegrating tablet Take 1 tablet (4 mg total) by mouth every 6 (six) hours as needed for nausea. 40 tablet 3    Prenatal Vit-Fe Fumarate-FA (PRENATAL VITAMINS PO) Take 1 capsule by mouth daily.       Review of Systems  Constitutional:  Negative for chills and fever.  HENT:  Negative for congestion and sore throat.   Eyes:  Negative for pain and visual disturbance.  Respiratory:  Negative for cough, chest tightness and shortness of breath.   Cardiovascular:  Positive for chest pain.  Gastrointestinal:  Negative for abdominal pain, diarrhea, nausea and vomiting.  Endocrine: Negative for cold intolerance and heat intolerance.  Genitourinary:  Negative for dysuria and flank pain.  Musculoskeletal:  Negative for back pain.  Skin:  Negative for rash.  Allergic/Immunologic: Negative for food allergies.  Neurological:  Negative for dizziness and light-headedness.  Psychiatric/Behavioral:  Negative for agitation.    Physical  Exam   Blood pressure 118/64, pulse 87, temperature 99.3 F (37.4 C), resp. rate 17, height 5' 2 (1.575 m), weight 75.8 kg, last menstrual period 07/29/2023, SpO2 100%, currently breastfeeding.  Physical Exam Vitals and nursing note reviewed.  Constitutional:      General: She is not in acute distress.    Appearance: She is well-developed.     Comments: Pregnant female  HENT:     Head: Normocephalic and atraumatic.  Eyes:     General: No scleral icterus.    Conjunctiva/sclera: Conjunctivae normal.  Cardiovascular:     Rate and Rhythm: Normal rate.  Pulmonary:     Effort: Pulmonary effort is normal.  Chest:     Chest wall: No tenderness.  Abdominal:     Palpations: Abdomen is soft.     Tenderness: There is no abdominal tenderness. There is no guarding or rebound.      Comments: Gravid  Genitourinary:    Vagina: Normal.  Musculoskeletal:        General: Normal range of motion.     Cervical back: Normal range of motion and neck supple.     Comments: No TTP over entire left chest.   Skin:    General: Skin is warm and dry.     Findings: No rash.  Neurological:     Mental Status: She is alert and oriented to person, place, and time.     MAU Course  Procedures  MDM- moderate Unlikely cardiac given normal vitals and nature of pain.  Do not feel that EKG is warranted at this juncture given the location of the pain in the upper chest and shoulder region.  Would have a low threshold to perform this if patient presents with worsening pain or unresolved pain.   Trial of acetaminophen  and TUMs-overall reassuring that these 2 medications did help lower her pain from a 6 to a 4  Utilize Spanish interpretation for visit here today. Reviewed patient's chart.  Orders Placed This Encounter  Procedures   Urinalysis, Routine w reflex microscopic -Urine, Clean Catch    Standing Status:   Standing    Number of Occurrences:   1    Specimen Source:   Urine, Clean Catch [76]   Meds ordered this encounter  Medications   acetaminophen  (TYLENOL ) tablet 1,000 mg   calcium  carbonate (TUMS - dosed in mg elemental calcium ) chewable tablet 200 mg of elemental calcium      Assessment and Plan   1. Costochondritis   2. Pain, chest wall   3. [redacted] weeks gestation of pregnancy   Discharged stable condition Improved pain in the upper shoulder and chest after acetaminophen  and Tums Reviewed return precautions in detail with Spanish interpreter present   Suzen Maryan Masters 01/19/2024, 9:12 PM

## 2024-02-03 ENCOUNTER — Encounter: Payer: Self-pay | Admitting: Obstetrics and Gynecology

## 2024-02-03 ENCOUNTER — Ambulatory Visit (INDEPENDENT_AMBULATORY_CARE_PROVIDER_SITE_OTHER): Admitting: Obstetrics and Gynecology

## 2024-02-03 ENCOUNTER — Other Ambulatory Visit: Payer: Self-pay | Admitting: Obstetrics and Gynecology

## 2024-02-03 VITALS — BP 115/70 | HR 90 | Wt 169.0 lb

## 2024-02-03 DIAGNOSIS — Z98891 History of uterine scar from previous surgery: Secondary | ICD-10-CM

## 2024-02-03 DIAGNOSIS — Z758 Other problems related to medical facilities and other health care: Secondary | ICD-10-CM

## 2024-02-03 DIAGNOSIS — Z603 Acculturation difficulty: Secondary | ICD-10-CM | POA: Diagnosis not present

## 2024-02-03 DIAGNOSIS — Z3480 Encounter for supervision of other normal pregnancy, unspecified trimester: Secondary | ICD-10-CM | POA: Diagnosis not present

## 2024-02-03 DIAGNOSIS — N644 Mastodynia: Secondary | ICD-10-CM | POA: Diagnosis not present

## 2024-02-03 NOTE — Progress Notes (Signed)
   PRENATAL VISIT NOTE  Subjective:  Ashley Payne is a 33 y.o. [phone] at [redacted]w[redacted]d being seen today for ongoing prenatal care.  She is currently monitored for the following issues for this high-risk pregnancy and has History of recurrent miscarriages; Language barrier; History of cesarean section; GBS (group B Streptococcus carrier), +RV culture, currently pregnant; Normal pregnancy in multigravida; Contraception management; and History of VBAC on their problem list.  Patient reports left breast pain. Seen at hospital on 8/19 for similar concern, given tylenol  and acid medication which has improved her symptoms but still comes/goes.  Contractions: Irregular. Vag. Bleeding: None.  Movement: Present. Denies leaking of fluid.   The following portions of the patient's history were reviewed and updated as appropriate: allergies, current medications, past family history, past medical history, past social history, past surgical history and problem list.   Objective:   Vitals:   02/03/24 1507  BP: 115/70  Pulse: 90  Weight: 169 lb (76.7 kg)   Body mass index is 30.91 kg/m. Total weight gain: 24 lb (10.9 kg)   Fetal Status: Fetal Heart Rate (bpm): 154   Movement: Present     General:  Alert, oriented and cooperative. Patient is in no acute distress.  Skin: Skin is warm and dry. No rash noted.   Cardiovascular: Normal heart rate noted  Respiratory: Normal respiratory effort, no problems with respiration noted  Abdomen: Soft, gravid, appropriate for gestational age.  Pain/Pressure: Present     Pelvic: Cervical exam deferred        Extremities: Normal range of motion.  Edema: None  Mental Status:  Breast Normal mood and affect. Normal behavior. Normal judgment and thought content. Left breast without lump in area of concern   Assessment and Plan:  Pregnancy: [id number] at [redacted]w[redacted]d 1. Supervision of other normal pregnancy, antepartum (Primary) Anticipatory guidance Third trimester labs  next visit  2. History of VBAC Plans VBAC  3. History of cesarean section Plans VBAC  4. Language barrier In person interpreter utilized  5. Breast pain, left Will get ultrasound however suspect will be normal - US  LIMITED ULTRASOUND INCLUDING AXILLA LEFT BREAST ; Future   Preterm labor symptoms and general obstetric precautions including but not limited to vaginal bleeding, contractions, leaking of fluid and fetal movement were reviewed in detail with the patient. Please refer to After Visit Summary for other counseling recommendations.   Return in about 2 weeks (around 02/17/2024).  No future appointments.  Rollo ONEIDA Bring, MD

## 2024-02-03 NOTE — Progress Notes (Signed)
 rou

## 2024-02-11 ENCOUNTER — Encounter

## 2024-02-11 ENCOUNTER — Ambulatory Visit
Admission: RE | Admit: 2024-02-11 | Discharge: 2024-02-11 | Disposition: A | Discharge status: Home / Self Care | Source: Ambulatory Visit | Attending: Obstetrics and Gynecology | Admitting: Obstetrics and Gynecology

## 2024-02-11 ENCOUNTER — Other Ambulatory Visit

## 2024-02-11 DIAGNOSIS — N644 Mastodynia: Secondary | ICD-10-CM

## 2024-02-17 ENCOUNTER — Ambulatory Visit: Admitting: Obstetrics & Gynecology

## 2024-02-17 VITALS — BP 92/51 | HR 86 | Wt 169.0 lb

## 2024-02-17 DIAGNOSIS — Z758 Other problems related to medical facilities and other health care: Secondary | ICD-10-CM

## 2024-02-17 DIAGNOSIS — Z1331 Encounter for screening for depression: Secondary | ICD-10-CM

## 2024-02-17 DIAGNOSIS — Z98891 History of uterine scar from previous surgery: Secondary | ICD-10-CM | POA: Diagnosis not present

## 2024-02-17 DIAGNOSIS — O0992 Supervision of high risk pregnancy, unspecified, second trimester: Secondary | ICD-10-CM | POA: Diagnosis not present

## 2024-02-17 DIAGNOSIS — O34219 Maternal care for unspecified type scar from previous cesarean delivery: Secondary | ICD-10-CM | POA: Diagnosis not present

## 2024-02-17 DIAGNOSIS — O99820 Streptococcus B carrier state complicating pregnancy: Secondary | ICD-10-CM

## 2024-02-17 DIAGNOSIS — O0990 Supervision of high risk pregnancy, unspecified, unspecified trimester: Secondary | ICD-10-CM | POA: Diagnosis not present

## 2024-02-17 DIAGNOSIS — Z3A27 27 weeks gestation of pregnancy: Secondary | ICD-10-CM | POA: Diagnosis not present

## 2024-02-17 DIAGNOSIS — Z603 Acculturation difficulty: Secondary | ICD-10-CM

## 2024-02-17 DIAGNOSIS — Z3480 Encounter for supervision of other normal pregnancy, unspecified trimester: Secondary | ICD-10-CM

## 2024-02-17 NOTE — Progress Notes (Unsigned)
 CAP interpreter Vernona Rieger.

## 2024-02-17 NOTE — Progress Notes (Unsigned)
   PRENATAL VISIT NOTE  Subjective:  Ashley Payne is a 33 y.o. [phone] at [redacted]w[redacted]d being seen today for ongoing prenatal care.  She is currently monitored for the following issues for this high-risk pregnancy and has History of recurrent miscarriages; Language barrier; History of cesarean section; GBS (group B Streptococcus carrier), +RV culture, currently pregnant; Normal pregnancy in multigravida; Contraception management; and History of VBAC on their problem list.  Patient reports occasional contractions.  Contractions: Irritability. Vag. Bleeding: None.  Movement: Present. Denies leaking of fluid.   The following portions of the patient's history were reviewed and updated as appropriate: allergies, current medications, past family history, past medical history, past social history, past surgical history and problem list.   Objective:    Vitals:   02/17/24 0838  BP: (!) 92/51  Pulse: 86  Weight: 169 lb (76.7 kg)    Fetal Status:  Fetal Heart Rate (bpm): 154   Movement: Present    General: Alert, oriented and cooperative. Patient is in no acute distress.  Skin: Skin is warm and dry. No rash noted.   Cardiovascular: Normal heart rate noted  Respiratory: Normal respiratory effort, no problems with respiration noted  Abdomen: Soft, gravid, appropriate for gestational age.  Pain/Pressure: Present     Pelvic: Cervical exam deferred        Extremities: Normal range of motion.  Edema: None  Mental Status: Normal mood and affect. Normal behavior. Normal judgment and thought content.   Assessment and Plan:  Pregnancy: [id number] at [redacted]w[redacted]d 1. [redacted] weeks gestation of pregnancy (Primary) [redacted]w[redacted]d   2. Supervision of high risk pregnancy, antepartum screening - CBC - Glucose Tolerance, 2 Hours w/1 Hour - HIV Antibody (routine testing w rflx) - RPR  3. Language barrier Spanish interpreter  4. GBS (group B Streptococcus carrier), +RV culture, currently pregnant Previous pregnancy not  active problem  5. History of cesarean section And VBAC  6. History of VBAC   7. Normal pregnancy in multigravida   Preterm labor symptoms and general obstetric precautions including but not limited to vaginal bleeding, contractions, leaking of fluid and fetal movement were reviewed in detail with the patient. Please refer to After Visit Summary for other counseling recommendations.   Return in about 2 weeks (around 03/02/2024).  Future Appointments  Date Time Provider Department Center  03/02/2024  3:10 PM Abigail Rollo DASEN, MD [facility] None  03/16/2024  3:10 PM Anyanwu, Gloris LABOR, MD [facility] None  03/30/2024  3:10 PM Dunn, Rollo DASEN, MD [facility] None    Lynwood Solomons, MD

## 2024-02-18 ENCOUNTER — Ambulatory Visit: Payer: Self-pay | Admitting: Obstetrics & Gynecology

## 2024-02-18 LAB — GLUCOSE TOLERANCE, 2 HOURS W/ 1HR
Glucose, 1 hour: 129 mg/dL (ref 70–179)
Glucose, 2 hour: 94 mg/dL (ref 70–152)
Glucose, Fasting: 75 mg/dL (ref 70–91)

## 2024-02-18 LAB — CBC
Hematocrit: 32 % — ABNORMAL LOW (ref 34.0–46.6)
Hemoglobin: 10.5 g/dL — ABNORMAL LOW (ref 11.1–15.9)
MCH: 29.4 pg (ref 26.6–33.0)
MCHC: 32.8 g/dL (ref 31.5–35.7)
MCV: 90 fL (ref 79–97)
Platelets: 213 x10E3/uL (ref 150–450)
RBC: 3.57 x10E6/uL — ABNORMAL LOW (ref 3.77–5.28)
RDW: 11.9 % (ref 11.7–15.4)
WBC: 10.9 x10E3/uL — ABNORMAL HIGH (ref 3.4–10.8)

## 2024-02-18 LAB — HIV ANTIBODY (ROUTINE TESTING W REFLEX): HIV Screen 4th Generation wRfx: NONREACTIVE

## 2024-02-18 LAB — RPR: RPR Ser Ql: NONREACTIVE

## 2024-02-19 ENCOUNTER — Encounter: Payer: Self-pay | Admitting: Family Medicine

## 2024-02-23 ENCOUNTER — Encounter: Payer: Self-pay | Admitting: Family Medicine

## 2024-03-02 ENCOUNTER — Ambulatory Visit (INDEPENDENT_AMBULATORY_CARE_PROVIDER_SITE_OTHER): Admitting: Obstetrics and Gynecology

## 2024-03-02 VITALS — BP 103/61 | HR 93 | Wt 176.0 lb

## 2024-03-02 DIAGNOSIS — Z758 Other problems related to medical facilities and other health care: Secondary | ICD-10-CM

## 2024-03-02 DIAGNOSIS — Z3A29 29 weeks gestation of pregnancy: Secondary | ICD-10-CM | POA: Diagnosis not present

## 2024-03-02 DIAGNOSIS — Z603 Acculturation difficulty: Secondary | ICD-10-CM | POA: Diagnosis not present

## 2024-03-02 DIAGNOSIS — Z23 Encounter for immunization: Secondary | ICD-10-CM | POA: Diagnosis not present

## 2024-03-02 DIAGNOSIS — O99019 Anemia complicating pregnancy, unspecified trimester: Secondary | ICD-10-CM

## 2024-03-02 DIAGNOSIS — Z98891 History of uterine scar from previous surgery: Secondary | ICD-10-CM

## 2024-03-02 DIAGNOSIS — Z3480 Encounter for supervision of other normal pregnancy, unspecified trimester: Secondary | ICD-10-CM

## 2024-03-02 MED ORDER — FERROUS SULFATE 325 (65 FE) MG PO TBEC
325.0000 mg | DELAYED_RELEASE_TABLET | ORAL | 3 refills | Status: AC
Start: 2024-03-02 — End: ?

## 2024-03-02 NOTE — Progress Notes (Signed)
   PRENATAL VISIT NOTE  Subjective:  Ashley Payne is a 33 y.o. [phone] at [redacted]w[redacted]d being seen today for ongoing prenatal care.  She is currently monitored for the following issues for this high-risk pregnancy and has History of recurrent miscarriages; Language barrier; History of cesarean section; Normal pregnancy in multigravida; Contraception management; and History of VBAC on their problem list.  Patient reports no complaints.  Contractions: Irregular. Vag. Bleeding: None.  Movement: Present. Denies leaking of fluid.   The following portions of the patient's history were reviewed and updated as appropriate: allergies, current medications, past family history, past medical history, past social history, past surgical history and problem list.   Objective:   Vitals:   03/02/24 1454  BP: 103/61  Pulse: 93  Weight: 176 lb (79.8 kg)   Body mass index is 32.19 kg/m. Total weight gain: 31 lb (14.1 kg)   Fetal Status: Fetal Heart Rate (bpm): 147 Fundal Height: 29 cm Movement: Present     General:  Alert, oriented and cooperative. Patient is in no acute distress.  Skin: Skin is warm and dry. No rash noted.   Cardiovascular: Normal heart rate noted  Respiratory: Normal respiratory effort, no problems with respiration noted  Abdomen: Soft, gravid, appropriate for gestational age.  Pain/Pressure: Present     Pelvic: Cervical exam deferred        Extremities: Normal range of motion.  Edema: None  Mental Status: Normal mood and affect. Normal behavior. Normal judgment and thought content.   Assessment and Plan:  Pregnancy: [id number] at [redacted]w[redacted]d 1. Normal pregnancy in multigravida (Primary) Anticipatory guidance Good FM   2. History of VBAC We briefly reviewed risks of TOLAC including risks of uterine rupture of 0.5-1% with risks of morbidity/mortality to mom/baby. Spanish VBAC consent form unavailable today. Will review in more detail with patient with Spanish form at upcoming visit.  Good candidate with prior VBAC  3. Language barrier In person spanish interpreter  4. [redacted] weeks gestation of pregnancy   5. Need for influenza vaccination  - Flu vaccine trivalent PF, 6mos and older(Flulaval,Afluria,Fluarix,Fluzone)  6. Need for Tdap vaccination  - Tdap vaccine greater than or equal to 7yo IM  7. Antepartum anemia  - ferrous sulfate 325 (65 FE) MG EC tablet; Take 1 tablet (325 mg total) by mouth every other day.  Dispense: 30 tablet; Refill: 3   Preterm labor symptoms and general obstetric precautions including but not limited to vaginal bleeding, contractions, leaking of fluid and fetal movement were reviewed in detail with the patient. Please refer to After Visit Summary for other counseling recommendations.   No follow-ups on file.  Future Appointments  Date Time Provider Department Center  03/16/2024  3:10 PM Anyanwu, Gloris LABOR, MD [facility] None  03/30/2024  3:10 PM Abigail Rollo DASEN, MD [facility] None  04/14/2024  3:10 PM Stinson, Jacob J, DO [facility] None  04/21/2024  3:10 PM Stinson, Jacob J, DO [facility] None  04/27/2024  3:10 PM Sreya Froio, Rollo DASEN, MD [facility] None    Rollo DASEN Abigail, MD

## 2024-03-11 ENCOUNTER — Encounter (HOSPITAL_COMMUNITY): Payer: Self-pay | Admitting: Obstetrics and Gynecology

## 2024-03-11 ENCOUNTER — Inpatient Hospital Stay (HOSPITAL_COMMUNITY)
Admission: AD | Admit: 2024-03-11 | Discharge: 2024-03-11 | Disposition: A | Discharge status: Home / Self Care | Attending: Obstetrics and Gynecology | Admitting: Obstetrics and Gynecology

## 2024-03-11 DIAGNOSIS — Z3A31 31 weeks gestation of pregnancy: Secondary | ICD-10-CM

## 2024-03-11 DIAGNOSIS — O4703 False labor before 37 completed weeks of gestation, third trimester: Secondary | ICD-10-CM | POA: Diagnosis present

## 2024-03-11 DIAGNOSIS — O479 False labor, unspecified: Secondary | ICD-10-CM

## 2024-03-11 LAB — URINALYSIS, ROUTINE W REFLEX MICROSCOPIC
Bilirubin Urine: NEGATIVE
Glucose, UA: NEGATIVE mg/dL
Hgb urine dipstick: NEGATIVE
Ketones, ur: NEGATIVE mg/dL
Leukocytes,Ua: NEGATIVE
Nitrite: NEGATIVE
Protein, ur: NEGATIVE mg/dL
Specific Gravity, Urine: 1.005 (ref 1.005–1.030)
pH: 7 (ref 5.0–8.0)

## 2024-03-11 LAB — WET PREP, GENITAL
Sperm: NONE SEEN
Trich, Wet Prep: NONE SEEN
WBC, Wet Prep HPF POC: >=10 — AB (ref <10)
Yeast Wet Prep HPF POC: NONE SEEN

## 2024-03-11 MED ORDER — TERBUTALINE SULFATE 1 MG/ML IJ SOLN
0.2500 mg | Freq: Once | INTRAMUSCULAR | Status: AC
  Administered 2024-03-11: 0.25 mg via SUBCUTANEOUS
  Filled 2024-03-11: qty 1

## 2024-03-11 MED ORDER — LACTATED RINGERS IV BOLUS
1000.0000 mL | Freq: Once | INTRAVENOUS | Status: AC
  Administered 2024-03-11: 1000 mL via INTRAVENOUS

## 2024-03-11 MED ORDER — NIFEDIPINE 10 MG PO CAPS: 10.0000 mg | ORAL_CAPSULE | ORAL | 0 refills | Status: DC | PRN

## 2024-03-11 NOTE — MAU Note (Signed)
 Ashley Payne is a 33 y.o. at [redacted]w[redacted]d here in MAU reporting: c/o ctx and pelvic that started yesterday . Today they are closer together 10-09-13 min apart. Reports having some white mucusy discharge.   LMP:  Onset of complaint: yesterday Pain score: 7 Vitals:   03/11/24 1621  BP: 121/63  Pulse: 97  Resp: 18  Temp: 98.5 F (36.9 C)     FHT: 148  Lab orders placed from triage: u/a

## 2024-03-11 NOTE — MAU Provider Note (Signed)
  History     CSN: [id number]  Arrival date and time: 03/11/24 8447 First Provider Initiated Contact with Patient 03/11/2024  4:18 PM  Chief Complaint  Patient presents with   Contractions    HPI Liyat Zakara Parkey is a 33 y.o. H4E7977 at [redacted]w[redacted]d, 05/12/2024, by Ultrasound, who presents to the Maternity Assessment Unit for ctx. She reports intermittent, rated 7/10. Endorses adequate water intake, denies recent sexual activity.   ROS Contractions: Yes  Fetal Movement: Yes  Vaginal bleeding: No  LOF: No    Medications Prior to Admission  Medication Sig Dispense Refill Last Dose/Taking   ferrous sulfate 325 (65 FE) MG EC tablet Take 1 tablet (325 mg total) by mouth every other day. 30 tablet 3 03/11/2024   ondansetron  (ZOFRAN -ODT) 4 MG disintegrating tablet Take 1 tablet (4 mg total) by mouth every 6 (six) hours as needed for nausea. 40 tablet 3 03/10/2024   Prenatal Vit-Fe Fumarate-FA (PRENATAL VITAMINS PO) Take 1 capsule by mouth daily.   03/11/2024   acetaminophen  (TYLENOL ) 500 MG tablet Take 1 tablet (500 mg total) by mouth every 6 (six) hours as needed. (Patient not taking: Reported on 02/17/2024)      calcium  carbonate (TUMS) 500 MG chewable tablet Chew 1 tablet (200 mg of elemental calcium  total) by mouth daily. (Patient not taking: Reported on 02/17/2024)       Past Medical History:  Diagnosis Date   Medical history non-contributory    Did NOT proceed with Tubal Ligation on 12/05/21, Disregard TUBAL LIGATION on Surgical History   Normal labor 12/04/2021    Past Surgical History:  Procedure Laterality Date   CESAREAN SECTION  2021   NO PAST SURGERIES     TUBAL LIGATION Bilateral 12/05/2021   Procedure: POST PARTUM TUBAL LIGATION;  Surgeon: Ozan, Jennifer, DO;  Location: [facility];  Service: Gynecology;  Laterality: Bilateral;     Allergies: No Known Allergies  ROS reviewed and pertinent positives and negatives as documented in HPI.    Physical Exam  BP 117/72 (BP  Location: Right Arm)   Pulse 98   Temp 98.5 F (36.9 C)   Resp 18   Ht 5' 2 (1.575 m)   Wt 80.3 kg   LMP 07/29/2023   BMI 32.37 kg/m   Gen: alert, no acute distress CV: regular rate Resp: nonlabored Abd: nontender, gravid SSE: normal vulva, normal vaginal discharge, no cervicitis. FFN, wet prep, GC/CT collected.  Cervical Exam Closed, long, posterior  FHT Baseline: 130 bpm Variability: Good {> 6 bpm) Accelerations: Reactive Decelerations: Absent Uterine activity: Frequency: Every 3-5 minutes  Labs O/Positive/-- (05/08 1618)   No results found for this or any previous visit (from the past 24 hours).  Imaging No results found.   Assessment and Plan  MDM Peggye Tanazia Achee is a 33 y.o. H4E7977 at [redacted]w[redacted]d, 05/12/2024, by Ultrasound, who presents to the MAU for ctx.  Ddx: BH ctx, PTL, vaginitis, UTI. Pt feeling improved after 1L LR and terb x1. Ctx have spaced out on the monitor, pain now 6/10. Patient feels safe to be DC home. Rx nifedipine IR 10mg  prn. PTL precautions reviewed.  1. False labor (Primary)  2. [redacted] weeks gestation of pregnancy   Results pending at the time of DC: GC/CT Dispo: DC home in stable condition with return precautions discussed and included in AVS.    Barabara Maier, DO FMOB Fellow, Faculty Practice [facility], Center for [facility]

## 2024-03-14 ENCOUNTER — Ambulatory Visit: Payer: Self-pay

## 2024-03-14 LAB — GC/CHLAMYDIA PROBE AMP (~~LOC~~) NOT AT ARMC
Chlamydia: NEGATIVE
Comment: NEGATIVE
Comment: NORMAL
Neisseria Gonorrhea: NEGATIVE

## 2024-03-16 ENCOUNTER — Ambulatory Visit: Admitting: Obstetrics & Gynecology

## 2024-03-16 VITALS — BP 108/57 | HR 97 | Wt 179.0 lb

## 2024-03-16 DIAGNOSIS — Z3A31 31 weeks gestation of pregnancy: Secondary | ICD-10-CM

## 2024-03-16 DIAGNOSIS — Z3480 Encounter for supervision of other normal pregnancy, unspecified trimester: Secondary | ICD-10-CM

## 2024-03-16 DIAGNOSIS — O99013 Anemia complicating pregnancy, third trimester: Secondary | ICD-10-CM

## 2024-03-16 DIAGNOSIS — Z98891 History of uterine scar from previous surgery: Secondary | ICD-10-CM

## 2024-03-16 NOTE — Progress Notes (Signed)
   Patient is Spanish-speaking only, interpreter present for this encounter.  PRENATAL VISIT NOTE  Subjective:  Ashley Payne is a 33 y.o. [phone] at [redacted]w[redacted]d being seen today for ongoing prenatal care.  She is currently monitored for the following issues for this low-risk pregnancy and has History of recurrent miscarriages; Language barrier; History of cesarean section; Supervision of other normal pregnancy, antepartum; Contraception management; and History of VBAC on their problem list.  Patient reports no complaints.  Contractions: Irritability. Vag. Bleeding: None.  Movement: Present. Denies leaking of fluid.   The following portions of the patient's history were reviewed and updated as appropriate: allergies, current medications, past family history, past medical history, past social history, past surgical history and problem list.   Objective:    Vitals:   03/16/24 1507  BP: (!) 108/57  Pulse: 97  Weight: 179 lb (81.2 kg)    Fetal Status:  Fetal Heart Rate (bpm): 150 Fundal Height: 32 cm Movement: Present    General: Alert, oriented and cooperative. Patient is in no acute distress.  Skin: Skin is warm and dry. No rash noted.   Cardiovascular: Normal heart rate noted  Respiratory: Normal respiratory effort, no problems with respiration noted  Abdomen: Soft, gravid, appropriate for gestational age.  Pain/Pressure: Present     Pelvic: Cervical exam deferred        Extremities: Normal range of motion.  Edema: None  Mental Status: Normal mood and affect. Normal behavior. Normal judgment and thought content.   Assessment and Plan:  Pregnancy: H4E7977 at [redacted]w[redacted]d 1. Anemia affecting pregnancy in third trimester On oral iron therapy  2. History of VBAC 3. History of cesarean section Counseled regarding TOLAC vs RCS; risks/benefits discussed in detail. All questions answered.  Patient elects for TOLAC, consent signed 03/16/2024.  In the event she ends up having a RCS, she is  considering getting a BTL too. Medicaid papers also signed 03/16/2024.  4. [redacted] weeks gestation of pregnancy 5. Supervision of other normal pregnancy, antepartum (Primary) No other concerns. Preterm labor symptoms and general obstetric precautions including but not limited to vaginal bleeding, contractions, leaking of fluid and fetal movement were reviewed in detail with the patient. Please refer to After Visit Summary for other counseling recommendations.   Return in about 2 weeks (around 03/30/2024) for OFFICE OB VISIT (MD only).  Future Appointments  Date Time Provider Department Center  03/30/2024  3:10 PM Abigail Rollo DASEN, MD [facility] None  04/14/2024  3:10 PM Stinson, Jacob J, DO [facility] None  04/21/2024  3:10 PM Stinson, Jacob J, DO [facility] None  04/27/2024  3:10 PM Dunn, Rollo DASEN, MD [facility] None    Gloris Hugger, MD

## 2024-03-16 NOTE — Progress Notes (Signed)
 CAP interpreter Ashley Payne.

## 2024-03-28 ENCOUNTER — Encounter: Payer: Self-pay | Admitting: Obstetrics and Gynecology

## 2024-03-29 ENCOUNTER — Encounter: Payer: Self-pay | Admitting: Obstetrics and Gynecology

## 2024-03-29 ENCOUNTER — Other Ambulatory Visit: Payer: Self-pay

## 2024-03-29 ENCOUNTER — Ambulatory Visit: Admitting: Obstetrics and Gynecology

## 2024-03-29 VITALS — BP 127/66 | HR 99 | Wt 183.0 lb

## 2024-03-29 DIAGNOSIS — Z3483 Encounter for supervision of other normal pregnancy, third trimester: Secondary | ICD-10-CM

## 2024-03-29 DIAGNOSIS — Z98891 History of uterine scar from previous surgery: Secondary | ICD-10-CM

## 2024-03-29 DIAGNOSIS — Z603 Acculturation difficulty: Secondary | ICD-10-CM

## 2024-03-29 DIAGNOSIS — Z3480 Encounter for supervision of other normal pregnancy, unspecified trimester: Secondary | ICD-10-CM

## 2024-03-29 DIAGNOSIS — Z3A33 33 weeks gestation of pregnancy: Secondary | ICD-10-CM | POA: Diagnosis not present

## 2024-03-29 DIAGNOSIS — Z3009 Encounter for other general counseling and advice on contraception: Secondary | ICD-10-CM

## 2024-03-29 DIAGNOSIS — Z2911 Encounter for prophylactic immunotherapy for respiratory syncytial virus (RSV): Secondary | ICD-10-CM

## 2024-03-29 DIAGNOSIS — Z758 Other problems related to medical facilities and other health care: Secondary | ICD-10-CM

## 2024-03-29 NOTE — Progress Notes (Signed)
   PRENATAL VISIT NOTE  Subjective:  Ashley Payne is a 33 y.o. [phone] at [redacted]w[redacted]d being seen today for ongoing prenatal care.  She is currently monitored for the following issues for this high-risk pregnancy and has History of recurrent miscarriages; Language barrier; History of cesarean section; Supervision of other normal pregnancy, antepartum; Contraception management; History of VBAC; and Unwanted fertility on their problem list.  Patient transferring from [facility] due to proximity to her house.  Patient reports vaginal pressure and occasional contractions.  Contractions: Irritability. Vag. Bleeding: None.  Movement: Present. Denies leaking of fluid.   The following portions of the patient's history were reviewed and updated as appropriate: allergies, current medications, past family history, past medical history, past social history, past surgical history and problem list.   Objective:    Vitals:   03/29/24 1512  BP: 127/66  Pulse: 99  Weight: 183 lb (83 kg)    Fetal Status:  Fetal Heart Rate (bpm): 152 Fundal Height: 34 cm Movement: Present    General: Alert, oriented and cooperative. Patient is in no acute distress.  Skin: Skin is warm and dry. No rash noted.   Cardiovascular: Normal heart rate noted  Respiratory: Normal respiratory effort, no problems with respiration noted  Abdomen: Soft, gravid, appropriate for gestational age.  Pain/Pressure: Present (pain and pressure lower abd)     Pelvic: Cervical exam deferred        Extremities: Normal range of motion.  Edema: None  Mental Status: Normal mood and affect. Normal behavior. Normal judgment and thought content.   Assessment and Plan:  Pregnancy: [id number] at [redacted]w[redacted]d  1. Supervision of other normal pregnancy, antepartum (Primary) Counseled regarding risks/benefits of RSV vaccine, patient accepts vaccine.   2. History of VBAC  3. Language barrier Spanish interpretor used  4. History of cesarean section Plans  for TOLAC, consent previously signed  5. [redacted] weeks gestation of pregnancy  6. Unwanted fertility BTL papers signed 03/16/24, only wants it done if with CS   Preterm labor symptoms and general obstetric precautions including but not limited to vaginal bleeding, contractions, leaking of fluid and fetal movement were reviewed in detail with the patient. Please refer to After Visit Summary for other counseling recommendations.   Return in about 2 weeks (around 04/12/2024) for high OB, 36 week swabs.  Future Appointments  Date Time Provider Department Center  04/11/2024  3:15 PM Nicholaus Burnard HERO, MD [facility]    Burnard HERO Nicholaus, MD

## 2024-03-30 ENCOUNTER — Encounter: Admitting: Obstetrics and Gynecology

## 2024-04-02 ENCOUNTER — Encounter (HOSPITAL_COMMUNITY): Payer: Self-pay | Admitting: Obstetrics and Gynecology

## 2024-04-02 ENCOUNTER — Other Ambulatory Visit: Payer: Self-pay

## 2024-04-02 ENCOUNTER — Inpatient Hospital Stay (HOSPITAL_COMMUNITY)
Admission: AD | Admit: 2024-04-02 | Discharge: 2024-04-02 | Disposition: A | Discharge status: Home / Self Care | Attending: Obstetrics and Gynecology | Admitting: Obstetrics and Gynecology

## 2024-04-02 DIAGNOSIS — Z3689 Encounter for other specified antenatal screening: Secondary | ICD-10-CM

## 2024-04-02 DIAGNOSIS — Z758 Other problems related to medical facilities and other health care: Secondary | ICD-10-CM

## 2024-04-02 DIAGNOSIS — Z3A34 34 weeks gestation of pregnancy: Secondary | ICD-10-CM | POA: Diagnosis not present

## 2024-04-02 DIAGNOSIS — R109 Unspecified abdominal pain: Secondary | ICD-10-CM | POA: Diagnosis not present

## 2024-04-02 DIAGNOSIS — O26893 Other specified pregnancy related conditions, third trimester: Secondary | ICD-10-CM | POA: Diagnosis present

## 2024-04-02 DIAGNOSIS — Z3493 Encounter for supervision of normal pregnancy, unspecified, third trimester: Secondary | ICD-10-CM

## 2024-04-02 LAB — URINALYSIS, ROUTINE W REFLEX MICROSCOPIC
Bilirubin Urine: NEGATIVE
Glucose, UA: NEGATIVE mg/dL
Hgb urine dipstick: NEGATIVE
Ketones, ur: NEGATIVE mg/dL
Nitrite: NEGATIVE
Protein, ur: NEGATIVE mg/dL
Specific Gravity, Urine: 1.003 — ABNORMAL LOW (ref 1.005–1.030)
pH: 7 (ref 5.0–8.0)

## 2024-04-02 LAB — WET PREP, GENITAL
Clue Cells Wet Prep HPF POC: NONE SEEN
Sperm: NONE SEEN
Trich, Wet Prep: NONE SEEN
WBC, Wet Prep HPF POC: >=10 — AB (ref <10)
Yeast Wet Prep HPF POC: NONE SEEN

## 2024-04-02 LAB — POCT FERN TEST: POCT Fern Test: NEGATIVE

## 2024-04-02 LAB — RUPTURE OF MEMBRANE (ROM)PLUS: Rom Plus: NEGATIVE

## 2024-04-02 NOTE — MAU Provider Note (Signed)
 Chief Complaint:  Contractions and Rupture of Membranes   HPI   Event Date/Time   First Provider Initiated Contact with Patient 04/02/24 1546      IN Person Spanish Interpreter present during the entire encounter   Ashley Payne is a 33 y.o. [id number] at [redacted]w[redacted]d who presents to maternity admissions reporting she's been having ongoing contractions since yesterday getting more painful. She also reports that around 1300 today she noticed some watery discharge and ongoing pelvic pressure. Denies any VB, recent IC, and reports good FM's. Patient denies taking any medications for her discomfort and reports she has been trying to stay hydrated.  [facility]   Past Medical History:  Diagnosis Date   Medical history non-contributory    Did NOT proceed with Tubal Ligation on 12/05/21, Disregard TUBAL LIGATION on Surgical History   Normal labor 12/04/2021   OB History  Gravida Para Term Preterm AB Living  5 2 2  2 2   SAB IAB Ectopic Multiple Live Births  2   0 2    # Outcome Date GA Lbr Len/2nd Weight Sex Type Anes PTL Lv  5 Current           4 Term 12/05/21 [redacted]w[redacted]d 17:07 / 03:20 3690 g M VBAC EPI  LIV     Birth Comments: WNL  3 Term 03/30/20 [redacted]w[redacted]d    CS-LTranv EPI N LIV  2 SAB 04/22/19 [redacted]w[redacted]d         1 SAB 2018 [redacted]w[redacted]d          Past Surgical History:  Procedure Laterality Date   CESAREAN SECTION  2021   NO PAST SURGERIES     TUBAL LIGATION Bilateral 12/05/2021   Procedure: POST PARTUM TUBAL LIGATION;  Surgeon: Ozan, Jennifer, DO;  Location: [facility];  Service: Gynecology;  Laterality: Bilateral;   Family History  Problem Relation Age of Onset   Asthma Mother    Healthy Father    Breast cancer Maternal Grandmother 68 - 9   Cancer Maternal Grandmother    Diabetes Neg Hx    Heart disease Neg Hx    Hypertension Neg Hx    Social History   Tobacco Use   Smoking status: Never   Smokeless tobacco: Never  Vaping Use   Vaping status: Never Used  Substance Use  Topics   Alcohol use: No   Drug use: Never   No Known Allergies No medications prior to admission.    I have reviewed patient's Past Medical Hx, Surgical Hx, Family Hx, Social Hx, medications and allergies.   ROS  Pertinent items noted in HPI and remainder of comprehensive ROS otherwise negative.   PHYSICAL EXAM  Patient Vitals for the past 24 hrs:  BP Temp Temp src Pulse Resp SpO2 Height Weight  04/02/24 1812 (!) 102/58 -- -- 92 -- -- -- --  04/02/24 1624 (!) 112/59 -- -- 88 -- -- -- --  04/02/24 1620 -- -- -- -- -- 100 % -- --  04/02/24 1536 -- -- -- -- -- 99 % -- --  04/02/24 1534 (!) 106/58 98.1 F (36.7 C) Oral 93 18 -- 5' 3 (1.6 m) 82.1 kg    Constitutional: Well-developed, well-nourished female in no acute distress.  Cardiovascular: normal rate & rhythm, warm and well-perfused Respiratory: normal effort, no problems with respiration noted GI: Abd soft, non-tender, gravid, no ctx palpated MS: Extremities nontender, no edema, normal ROM Neurologic: Alert and oriented x 4.  GU: no CVA tenderness  Pelvic: Chaperoned by Kaylan Cower , RN SSE: Scant amount of fluid at the posterior fornix visualized, Vaginal swabs  obtained with ROM Plus , no evidence of blood in the vault, and cervix visually closed SVE: 1/Thick  Dilation: 1 Effacement (%): Thick Cervical Position: Posterior Exam by:: L. Zorawar Strollo NP  Fetal Tracing: NST Reactive Baseline: 130 Variability:marked Accelerations: present  Decelerations: absent Toco: CTX q 3-10 minutes and patient is acknowledging them    Labs: Results for orders placed or performed during the hospital encounter of 04/02/24 (from the past 24 hours)  POCT fern test     Status: Normal   Collection Time: 04/02/24  4:00 PM  Result Value Ref Range   POCT Fern Test Negative = intact amniotic membranes   Rupture of Membrane (ROM) Plus     Status: None   Collection Time: 04/02/24  4:21 PM  Result Value Ref Range   Rom Plus NEGATIVE   Wet  prep, genital     Status: Abnormal   Collection Time: 04/02/24  4:21 PM  Result Value Ref Range   Yeast Wet Prep HPF POC NONE SEEN NONE SEEN   Trich, Wet Prep NONE SEEN NONE SEEN   Clue Cells Wet Prep HPF POC NONE SEEN NONE SEEN   WBC, Wet Prep HPF POC >=10 (A) <10   Sperm NONE SEEN   Urinalysis, Routine w reflex microscopic -Urine, Random     Status: Abnormal   Collection Time: 04/02/24  4:22 PM  Result Value Ref Range   Color, Urine STRAW (A) YELLOW   APPearance CLEAR CLEAR   Specific Gravity, Urine 1.003 (L) 1.005 - 1.030   pH 7.0 5.0 - 8.0   Glucose, UA NEGATIVE NEGATIVE mg/dL   Hgb urine dipstick NEGATIVE NEGATIVE   Bilirubin Urine NEGATIVE NEGATIVE   Ketones, ur NEGATIVE NEGATIVE mg/dL   Protein, ur NEGATIVE NEGATIVE mg/dL   Nitrite NEGATIVE NEGATIVE   Leukocytes,Ua TRACE (A) NEGATIVE   RBC / HPF 0-5 0 - 5 RBC/hpf   WBC, UA 0-5 0 - 5 WBC/hpf   Bacteria, UA MANY (A) NONE SEEN   Squamous Epithelial / HPF 0-5 0 - 5 /HPF     MDM & MAU COURSE  MDM:  HIGH  Prenatal chart reviewed Physical exam performed with pelvic Vaginal swabs obtained -no evidence of infection Fern negative ROM plus negative NST for gestational age and fetal reassurance PO . hydration for contractions seen on toco  No evidence of rupture of membranes at this time or preterm labor based on negative fern, ROM plus negative, no cervical change in 2 hours  MAU Course: Orders Placed This Encounter  Procedures   Wet prep, genital   Rupture of Membrane (ROM) Plus   Urinalysis, Routine w reflex microscopic -Urine, Random   POCT fern test   Discharge patient Discharge disposition: 01-Home or Self Care; Discharge patient date: 04/02/2024   Discharge patient Discharge disposition: 01-Home or Self Care; Discharge patient date: 04/02/2024     I have reviewed the patient chart and performed the physical exam . I have ordered & interpreted the lab results and reviewed and interpreted the NST    A/P as  described below.  Counseling and education provided and patient agreeable  with plan as described below. Verbalized understanding.    ASSESSMENT   1. Language interpreter needed   2. [redacted] weeks gestation of pregnancy   3. Intact amniotic membranes during pregnancy in third trimester   4. NST (non-stress test) reactive on  fetal surveillance     PLAN  Discharge home in stable condition with return precautions.   See AVS for full description of information given to the patient including both verbal and written. Patient verbalized understanding and agrees with the plan as described above.     Follow-up Information     [facility] for [facility] Follow up.   Specialty: Obstetrics and Gynecology Why: If symptoms worsen or fail to resolve, As scheduled for ongoing prenatal care Contact information: [address] [place]  [id number] [phone]                Allergies as of 04/02/2024   No Known Allergies      Medication List     TAKE these medications    acetaminophen  500 MG tablet Commonly known as: TYLENOL  Take 1 tablet (500 mg total) by mouth every 6 (six) hours as needed.   calcium  carbonate 500 MG chewable tablet Commonly known as: Tums Chew 1 tablet (200 mg of elemental calcium  total) by mouth daily.   ferrous sulfate 325 (65 FE) MG EC tablet Take 1 tablet (325 mg total) by mouth every other day.   NIFEdipine 10 MG capsule Commonly known as: PROCARDIA Take 1 capsule (10 mg total) by mouth every 20 (twenty) minutes as needed (As needed for contractions. Up to 3 doses.).   ondansetron  4 MG disintegrating tablet Commonly known as: ZOFRAN -ODT Take 1 tablet (4 mg total) by mouth every 6 (six) hours as needed for nausea.   ondansetron  4 MG tablet Commonly known as: ZOFRAN  Take 4 mg by mouth every 8 (eight) hours as needed.   PRENATAL VITAMINS PO Take 1 capsule by mouth daily.   promethazine  25 MG  suppository Commonly known as: PHENERGAN  Place 25 mg rectally every 6 (six) hours as needed for nausea or vomiting.        Olam Dalton, MSN, [facility], Center for [facility]    This chart was dictated using voice recognition software, Dragon. Despite the best efforts of this provider to proofread and correct errors, errors may still occur which can change documentation meaning.

## 2024-04-02 NOTE — MAU Note (Addendum)
 Ashley Payne is a 33 y.o. at [redacted]w[redacted]d here in MAU reporting: Raquel in-person spanish interpreter present.  Reports ongoing ctxs since yesterday that are irregular today. States around 1300-1400 she had watery fluid come out. States it was clear/white cloudy in color and that fluid comes and goes. Reports ongoing pelvic pressure.  Denies any recent sexual intercourse. Patient denies any unusual vaginal discharge, vaginal odor, itching or pain.  Denies any VB. Reports +FM  Cervical exam 03/11/24 for preterm contractions- cervix closed   Onset of complaint: 1300-1400 Pain score: 7 Vitals:   04/02/24 1534 04/02/24 1536  BP: (!) 106/58   Pulse: 93   Resp: 18   Temp: 98.1 F (36.7 C)   SpO2:  99%     FHT:155 Lab orders placed from triage:  fern

## 2024-04-04 LAB — GC/CHLAMYDIA PROBE AMP (~~LOC~~) NOT AT ARMC
Chlamydia: NEGATIVE
Comment: NEGATIVE
Comment: NORMAL
Neisseria Gonorrhea: NEGATIVE

## 2024-04-07 ENCOUNTER — Inpatient Hospital Stay (HOSPITAL_COMMUNITY)
Admission: AD | Admit: 2024-04-07 | Discharge: 2024-04-07 | Disposition: A | Discharge status: Home / Self Care | Payer: Self-pay | Attending: Obstetrics & Gynecology | Admitting: Obstetrics & Gynecology

## 2024-04-07 ENCOUNTER — Encounter (HOSPITAL_COMMUNITY): Payer: Self-pay | Admitting: Obstetrics & Gynecology

## 2024-04-07 DIAGNOSIS — Z3A35 35 weeks gestation of pregnancy: Secondary | ICD-10-CM

## 2024-04-07 DIAGNOSIS — O479 False labor, unspecified: Secondary | ICD-10-CM

## 2024-04-07 DIAGNOSIS — O4703 False labor before 37 completed weeks of gestation, third trimester: Secondary | ICD-10-CM | POA: Insufficient documentation

## 2024-04-07 DIAGNOSIS — R109 Unspecified abdominal pain: Secondary | ICD-10-CM | POA: Diagnosis present

## 2024-04-07 LAB — URINALYSIS, ROUTINE W REFLEX MICROSCOPIC
Bilirubin Urine: NEGATIVE
Glucose, UA: NEGATIVE mg/dL
Hgb urine dipstick: NEGATIVE
Ketones, ur: NEGATIVE mg/dL
Leukocytes,Ua: NEGATIVE
Nitrite: NEGATIVE
Protein, ur: NEGATIVE mg/dL
Specific Gravity, Urine: 1.006 (ref 1.005–1.030)
pH: 8 (ref 5.0–8.0)

## 2024-04-07 NOTE — Discharge Instructions (Signed)
 Medidas de comodidad para el dolor del ligamento redondo: Sumrjase en una tina caliente Tylenol  1000 mg por va oral cada 6 a 8 horas segn sea necesario para el dolor Cambios lentos de posicin Haz masajes plvicos y estiramientos en silla (como te lo demostraron) Acostado sobre el lado afectado   Maternity Support [place] [employer] You can purchase on [employer] or through [place]. Below is an example.     Razones para volver a MAU/[facility]: Las contracciones son cada 5 minutos o menos, cada uno de los ltimos 1 minuto, stos han llevado a cabo durante 1-2 horas, y no se hydrographic surveyor o hablar durante ellas Usted tiene un gran chorro de lquido o un goteo de lquido que no se detiene y hay que usar una toalla Ha de sangrado que es de color rojo brillante, denso que el manchado - como sangrado menstrual (manchado puede ser normal en trabajo de parto prematuro o despus de una comprobacin de su cuello uterino) Usted no se siente el beb se mueve como l / ella hace normalmente

## 2024-04-07 NOTE — MAU Note (Signed)
 Ashley Payne is a 33 y.o. at [redacted]w[redacted]d here in MAU reporting: been  having contractions since last night, became regular early this morning, they are between 3-5 min.  Watery d/c noted a few days ago. Was told not ROM. Baby is moving.   Onset of complaint: 2300 Pain score: 8 Vitals:   04/07/24 0753  BP: 114/63  Pulse: 94  Resp: 18  Temp: 98.8 F (37.1 C)     FHT:134 Lab orders placed from triage:  urine

## 2024-04-07 NOTE — MAU Provider Note (Signed)
 Chief Complaint:  Contractions and Rupture of Membranes   HPI   None     Ashley Payne is a 33 y.o. [id number] at [redacted]w[redacted]d who presents to maternity admissions reporting contractions. She reports lower abdominal pain started last night and was irregular initially. Early this morning pain became regular, reports every 3-5 minutes. She states she feels it mostly in her lower abdomen, occasionally in her back. She also notes that she had watery discharge a few days ago on 11/1 and was evaluated at the MAU, determined no ROM. She has not had any watery discharge since that time. Endorses good fetal movement. Denies vaginal bleeding. No history of preterm delivery in prior pregnancies.  Due to language barrier, an in-person interpreter was present during the history-taking, physical exam, and subsequent discussion with this patient.    Pregnancy Course: Receives care at [facility] for [facility] . Prenatal records reviewed. Pregnancy complicated by history of cesarean with subsequent VBAC, history of recurrent miscarriages.  Past Medical History:  Diagnosis Date   Medical history non-contributory    Did NOT proceed with Tubal Ligation on 12/05/21, Disregard TUBAL LIGATION on Surgical History   Normal labor 12/04/2021   OB History  Gravida Para Term Preterm AB Living  5 2 2  2 2   SAB IAB Ectopic Multiple Live Births  2   0 2    # Outcome Date GA Lbr Len/2nd Weight Sex Type Anes PTL Lv  5 Current           4 Term 12/05/21 [redacted]w[redacted]d 17:07 / 03:20 3690 g M VBAC EPI  LIV     Birth Comments: WNL  3 Term 03/30/20 [redacted]w[redacted]d    CS-LTranv EPI N LIV  2 SAB 04/22/19 [redacted]w[redacted]d         1 SAB 2018 [redacted]w[redacted]d          Past Surgical History:  Procedure Laterality Date   CESAREAN SECTION  2021   NO PAST SURGERIES     TUBAL LIGATION Bilateral 12/05/2021   Procedure: POST PARTUM TUBAL LIGATION;  Surgeon: Ozan, Jennifer, DO;  Location: [facility] LD ORS;  Service: Gynecology;  Laterality: Bilateral;   Family  History  Problem Relation Age of Onset   Asthma Mother    Healthy Father    Breast cancer Maternal Grandmother 69 - 72   Cancer Maternal Grandmother    Diabetes Neg Hx    Heart disease Neg Hx    Hypertension Neg Hx    Social History   Tobacco Use   Smoking status: Never   Smokeless tobacco: Never  Vaping Use   Vaping status: Never Used  Substance Use Topics   Alcohol use: No   Drug use: Never   No Known Allergies Medications Prior to Admission  Medication Sig Dispense Refill Last Dose/Taking   ondansetron  (ZOFRAN ) 4 MG tablet Take 4 mg by mouth every 8 (eight) hours as needed.   04/07/2024 Morning   Prenatal Vit-Fe Fumarate-FA (PRENATAL VITAMINS PO) Take 1 capsule by mouth daily.   04/07/2024 Morning   acetaminophen  (TYLENOL ) 500 MG tablet Take 1 tablet (500 mg total) by mouth every 6 (six) hours as needed. (Patient not taking: Reported on 03/29/2024)      calcium  carbonate (TUMS) 500 MG chewable tablet Chew 1 tablet (200 mg of elemental calcium  total) by mouth daily. (Patient not taking: Reported on 03/29/2024)      ferrous sulfate 325 (65 FE) MG EC tablet Take 1 tablet (325 mg total)  by mouth every other day. 30 tablet 3    NIFEdipine (PROCARDIA) 10 MG capsule Take 1 capsule (10 mg total) by mouth every 20 (twenty) minutes as needed (As needed for contractions. Up to 3 doses.). (Patient not taking: Reported on 03/29/2024) 12 capsule 0    ondansetron  (ZOFRAN -ODT) 4 MG disintegrating tablet Take 1 tablet (4 mg total) by mouth every 6 (six) hours as needed for nausea. (Patient not taking: Reported on 03/29/2024) 40 tablet 3    promethazine  (PHENERGAN ) 25 MG suppository Place 25 mg rectally every 6 (six) hours as needed for nausea or vomiting.       I have reviewed patient's Past Medical Hx, Surgical Hx, Family Hx, Social Hx, medications and allergies.   ROS  Pertinent items noted in HPI and remainder of comprehensive ROS otherwise negative.   PHYSICAL EXAM  Patient Vitals for the  past 24 hrs:  BP Temp Temp src Pulse Resp Height Weight  04/07/24 0753 114/63 98.8 F (37.1 C) Oral 94 18 5' 3 (1.6 m) 82.1 kg    Constitutional: Well-developed, well-nourished female in no acute distress.  HEENT: atraumatic, normocephalic. Neck has normal ROM. EOM intact. Cardiovascular: normal rate & rhythm, warm and well-perfused Respiratory: normal effort, no problems with respiration noted GI: Abd soft, non-tender, non-distended MSK: Extremities nontender, no edema, normal ROM Skin: warm and dry. Acyanotic, no jaundice or pallor. Neurologic: Alert and oriented x 4. No abnormal coordination. Psychiatric: Normal mood. Speech not slurred, not rapid/pressured. Patient is cooperative. GU: no CVA tenderness Pelvic exam: exam chaperoned by Ivy NT.   Dilation: 1 Effacement (%): Thick Cervical Position: Posterior Station: Ballotable Exam by:: Joesph Sear PA-C  Fetal Tracing: Baseline FHR: 140 per minute Fetal heart variability: moderate Fetal Heart Rate accelerations: yes Fetal Heart Rate decelerations: none Fetal Non-stress Test: Category I (reactive) Toco: no uterine contractions   Labs: Results for orders placed or performed during the hospital encounter of 04/07/24 (from the past 24 hours)  Urinalysis, Routine w reflex microscopic -Urine, Clean Catch     Status: Abnormal   Collection Time: 04/07/24  8:05 AM  Result Value Ref Range   Color, Urine YELLOW YELLOW   APPearance HAZY (A) CLEAR   Specific Gravity, Urine 1.006 1.005 - 1.030   pH 8.0 5.0 - 8.0   Glucose, UA NEGATIVE NEGATIVE mg/dL   Hgb urine dipstick NEGATIVE NEGATIVE   Bilirubin Urine NEGATIVE NEGATIVE   Ketones, ur NEGATIVE NEGATIVE mg/dL   Protein, ur NEGATIVE NEGATIVE mg/dL   Nitrite NEGATIVE NEGATIVE   Leukocytes,Ua NEGATIVE NEGATIVE    Imaging:  No results found.  MDM & MAU COURSE  MDM: Moderate  MAU Course: -Vital signs within normal limits. -No contractions palpated on exam when patient  reported feeling a contraction. -Cervix 1 cm on exam, no change from prior check. Not in labor. -Encouraged increased PO hydration and adequate nutrition. Discussed nifedipine for symptomatic relief, patient declines at this time.  Orders Placed This Encounter  Procedures   Urinalysis, Routine w reflex microscopic -Urine, Clean Catch   Fern Test   Discharge patient   No orders of the defined types were placed in this encounter.   ASSESSMENT   1. Uterine contractions at greater than 20 weeks of gestation   2. [redacted] weeks gestation of pregnancy     PLAN  Discharge home in stable condition with preterm labor precautions.  Discussed use of support belt for lower abdominal pain. Increased hydration and high protein snacks every 3 hours.  Allergies as of 04/07/2024   No Known Allergies      Medication List     TAKE these medications    acetaminophen  500 MG tablet Commonly known as: TYLENOL  Take 1 tablet (500 mg total) by mouth every 6 (six) hours as needed.   calcium  carbonate 500 MG chewable tablet Commonly known as: Tums Chew 1 tablet (200 mg of elemental calcium  total) by mouth daily.   ferrous sulfate 325 (65 FE) MG EC tablet Take 1 tablet (325 mg total) by mouth every other day.   NIFEdipine 10 MG capsule Commonly known as: PROCARDIA Take 1 capsule (10 mg total) by mouth every 20 (twenty) minutes as needed (As needed for contractions. Up to 3 doses.).   ondansetron  4 MG disintegrating tablet Commonly known as: ZOFRAN -ODT Take 1 tablet (4 mg total) by mouth every 6 (six) hours as needed for nausea.   ondansetron  4 MG tablet Commonly known as: ZOFRAN  Take 4 mg by mouth every 8 (eight) hours as needed.   PRENATAL VITAMINS PO Take 1 capsule by mouth daily.   promethazine  25 MG suppository Commonly known as: PHENERGAN  Place 25 mg rectally every 6 (six) hours as needed for nausea or vomiting.        Joesph DELENA Sear, PA

## 2024-04-11 ENCOUNTER — Other Ambulatory Visit: Payer: Self-pay

## 2024-04-11 ENCOUNTER — Ambulatory Visit (INDEPENDENT_AMBULATORY_CARE_PROVIDER_SITE_OTHER): Admitting: Obstetrics and Gynecology

## 2024-04-11 VITALS — BP 119/64 | HR 94 | Wt 183.0 lb

## 2024-04-11 DIAGNOSIS — Z3A35 35 weeks gestation of pregnancy: Secondary | ICD-10-CM

## 2024-04-11 DIAGNOSIS — Z98891 History of uterine scar from previous surgery: Secondary | ICD-10-CM | POA: Diagnosis not present

## 2024-04-11 DIAGNOSIS — Z603 Acculturation difficulty: Secondary | ICD-10-CM | POA: Diagnosis not present

## 2024-04-11 DIAGNOSIS — Z3009 Encounter for other general counseling and advice on contraception: Secondary | ICD-10-CM

## 2024-04-11 DIAGNOSIS — Z3483 Encounter for supervision of other normal pregnancy, third trimester: Secondary | ICD-10-CM | POA: Diagnosis not present

## 2024-04-11 DIAGNOSIS — Z3480 Encounter for supervision of other normal pregnancy, unspecified trimester: Secondary | ICD-10-CM

## 2024-04-11 DIAGNOSIS — Z758 Other problems related to medical facilities and other health care: Secondary | ICD-10-CM

## 2024-04-11 NOTE — Progress Notes (Signed)
 PRENATAL VISIT NOTE  Subjective:  Ashley Payne is a 33 y.o. [phone] at [redacted]w[redacted]d being seen today for ongoing prenatal care.  She is currently monitored for the following issues for this high-risk pregnancy and has History of recurrent miscarriages; Language barrier; History of cesarean section; Supervision of other normal pregnancy, antepartum; History of VBAC; and Unwanted fertility on their problem list.  Patient reports some contractions, particularly at night.  Contractions: Not present. Vag. Bleeding: None.  Movement: Present. Denies leaking of fluid.   The following portions of the patient's history were reviewed and updated as appropriate: allergies, current medications, past family history, past medical history, past social history, past surgical history and problem list.   Objective:   Vitals:   04/11/24 1535  BP: 119/64  Pulse: 94  Weight: 183 lb (83 kg)    Fetal Status:  Fetal Heart Rate (bpm): 131   Movement: Present    General: Alert, oriented and cooperative. Patient is in no acute distress.  Skin: Skin is warm and dry. No rash noted.   Cardiovascular: Normal heart rate noted  Respiratory: Normal respiratory effort, no problems with respiration noted  Abdomen: Soft, gravid, appropriate for gestational age.  Pain/Pressure: Absent     Pelvic: Cervical exam deferred        Extremities: Normal range of motion.  Edema: None  Mental Status: Normal mood and affect. Normal behavior. Normal judgment and thought content.      02/17/2024    8:50 AM 11/05/2023    4:12 PM 03/06/2023    3:09 PM  Depression screen PHQ 2/9  Decreased Interest 0 0 0  Down, Depressed, Hopeless 0 0 0  PHQ - 2 Score 0 0 0  Altered sleeping 0 0 0  Tired, decreased energy 0 0 0  Change in appetite 0 0 0  Feeling bad or failure about yourself  0 0 0  Trouble concentrating 0 0 0  Moving slowly or fidgety/restless 0 0 0  Suicidal thoughts 0 0 0  PHQ-9 Score 0  0  0      Data saved with a  previous flowsheet row definition        02/17/2024    8:50 AM 11/05/2023    4:13 PM 03/06/2023    3:09 PM 12/02/2022    9:24 AM  GAD 7 : Generalized Anxiety Score  Nervous, Anxious, on Edge 0 0 0 0  Control/stop worrying 0 0 0 0  Worry too much - different things 0 0 0 0  Trouble relaxing 0 0 0 0  Restless 0 0 0 0  Easily annoyed or irritable 0 0 0 0  Afraid - awful might happen 0 0 0 0  Total GAD 7 Score 0 0 0 0    Assessment and Plan:  Pregnancy: [id number] at [redacted]w[redacted]d  1. Language barrier (Primary) Spanish translator used  2. History of cesarean section Plans for TOLAC  3. Supervision of other normal pregnancy, antepartum  4. Unwanted fertility BTL if has RCS Wants nexplanon if vaginal  5. History of VBAC Consent previously signed for TOLAC  6. [redacted] weeks gestation of pregnancy   Preterm labor symptoms and general obstetric precautions including but not limited to vaginal bleeding, contractions, leaking of fluid and fetal movement were reviewed in detail with the patient. Please refer to After Visit Summary for other counseling recommendations.   Return in about 1 week (around 04/18/2024) for high OB.  No future appointments.  Burnard CHRISTELLA Moats, MD

## 2024-04-14 ENCOUNTER — Encounter: Admitting: Family Medicine

## 2024-04-14 LAB — CULTURE, BETA STREP (GROUP B ONLY): Strep Gp B Culture: POSITIVE — AB

## 2024-04-18 ENCOUNTER — Encounter: Payer: Self-pay | Admitting: *Deleted

## 2024-04-18 ENCOUNTER — Ambulatory Visit: Payer: Self-pay | Admitting: Obstetrics and Gynecology

## 2024-04-18 DIAGNOSIS — B951 Streptococcus, group B, as the cause of diseases classified elsewhere: Secondary | ICD-10-CM | POA: Insufficient documentation

## 2024-04-21 ENCOUNTER — Encounter: Admitting: Family Medicine

## 2024-04-21 ENCOUNTER — Other Ambulatory Visit: Payer: Self-pay

## 2024-04-21 MED ORDER — ONDANSETRON HCL 4 MG PO TABS: 4.0000 mg | ORAL_TABLET | Freq: Three times a day (TID) | ORAL | 0 refills | Status: DC | PRN

## 2024-04-22 ENCOUNTER — Other Ambulatory Visit (HOSPITAL_COMMUNITY)
Admission: RE | Admit: 2024-04-22 | Discharge: 2024-04-22 | Disposition: A | Discharge status: Home / Self Care | Source: Ambulatory Visit | Attending: Certified Nurse Midwife | Admitting: Certified Nurse Midwife

## 2024-04-22 ENCOUNTER — Ambulatory Visit (INDEPENDENT_AMBULATORY_CARE_PROVIDER_SITE_OTHER): Admitting: Certified Nurse Midwife

## 2024-04-22 ENCOUNTER — Other Ambulatory Visit: Payer: Self-pay

## 2024-04-22 VITALS — BP 116/76 | HR 92 | Wt 183.4 lb

## 2024-04-22 DIAGNOSIS — Z603 Acculturation difficulty: Secondary | ICD-10-CM

## 2024-04-22 DIAGNOSIS — O99820 Streptococcus B carrier state complicating pregnancy: Secondary | ICD-10-CM | POA: Diagnosis not present

## 2024-04-22 DIAGNOSIS — O26893 Other specified pregnancy related conditions, third trimester: Secondary | ICD-10-CM

## 2024-04-22 DIAGNOSIS — Z3A37 37 weeks gestation of pregnancy: Secondary | ICD-10-CM

## 2024-04-22 DIAGNOSIS — Z758 Other problems related to medical facilities and other health care: Secondary | ICD-10-CM

## 2024-04-22 DIAGNOSIS — Z98891 History of uterine scar from previous surgery: Secondary | ICD-10-CM | POA: Diagnosis not present

## 2024-04-22 DIAGNOSIS — O321XX1 Maternal care for breech presentation, fetus 1: Secondary | ICD-10-CM

## 2024-04-22 DIAGNOSIS — N898 Other specified noninflammatory disorders of vagina: Secondary | ICD-10-CM

## 2024-04-22 DIAGNOSIS — Z3480 Encounter for supervision of other normal pregnancy, unspecified trimester: Secondary | ICD-10-CM

## 2024-04-22 DIAGNOSIS — Z3009 Encounter for other general counseling and advice on contraception: Secondary | ICD-10-CM

## 2024-04-22 NOTE — Progress Notes (Signed)
 PRENATAL VISIT NOTE  Subjective:  Ashley Payne is a 33 y.o. [phone] at [redacted]w[redacted]d being seen today for ongoing prenatal care.  She is currently monitored for the following issues for this high-risk pregnancy and has History of recurrent miscarriages; Language barrier; History of cesarean section; Supervision of other normal pregnancy, antepartum; History of VBAC; Unwanted fertility; and Group B streptococcal infection in pregnancy on their problem list.  Patient reports vaginal irritation.  Contractions: Irritability. Vag. Bleeding: None.  Movement: Present. Denies leaking of fluid.   The following portions of the patient's history were reviewed and updated as appropriate: allergies, current medications, past family history, past medical history, past social history, past surgical history and problem list.   Objective:   Vitals:   04/22/24 1108  BP: 116/76  Pulse: 92  Weight: 183 lb 6.4 oz (83.2 kg)    Fetal Status:  Fetal Heart Rate (bpm): 148 Fundal Height: 39 cm Movement: Present Presentation: Dempsey Breech  General: Alert, oriented and cooperative. Patient is in no acute distress.  Skin: Skin is warm and dry. No rash noted.   Cardiovascular: Normal heart rate noted  Respiratory: Normal respiratory effort, no problems with respiration noted  Abdomen: Soft, gravid, appropriate for gestational age.  Pain/Pressure: Present     Pelvic: Cervical exam deferred        Extremities: Normal range of motion.  Edema: None  Mental Status: Normal mood and affect. Normal behavior. Normal judgment and thought content.      02/17/2024    8:50 AM 11/05/2023    4:12 PM 03/06/2023    3:09 PM  Depression screen PHQ 2/9  Decreased Interest 0 0 0  Down, Depressed, Hopeless 0 0 0  PHQ - 2 Score 0 0 0  Altered sleeping 0 0 0  Tired, decreased energy 0 0 0  Change in appetite 0 0 0  Feeling bad or failure about yourself  0 0 0  Trouble concentrating 0 0 0  Moving slowly or fidgety/restless 0  0 0  Suicidal thoughts 0 0 0  PHQ-9 Score 0  0  0      Data saved with a previous flowsheet row definition        02/17/2024    8:50 AM 11/05/2023    4:13 PM 03/06/2023    3:09 PM 12/02/2022    9:24 AM  GAD 7 : Generalized Anxiety Score  Nervous, Anxious, on Edge 0 0 0 0  Control/stop worrying 0 0 0 0  Worry too much - different things 0 0 0 0  Trouble relaxing 0 0 0 0  Restless 0 0 0 0  Easily annoyed or irritable 0 0 0 0  Afraid - awful might happen 0 0 0 0  Total GAD 7 Score 0 0 0 0    Assessment and Plan:  Pregnancy: [id number] at [redacted]w[redacted]d 1. Supervision of other normal pregnancy, antepartum (Primary) - Doing well, feeling regular and vigorous fetal movement  2. [redacted] weeks gestation of pregnancy - Routine PNC, anticipatory guidance.    3. History of cesarean section - Desires TOLAC, consent signed 03/16/24  4. Language barrier - Spanish interpreter used for entire episode of care.   5. History of VBAC - Desires TOLAC, papers signed 03/16/24.   6. Unwanted fertility - BTL if CS, Nexplanon if vaginal. Papers signed 03/16/24.   7. GBS (group B Streptococcus carrier), +RV culture, currently pregnant - Antibiotics in labor.   8. Vaginal discharge in 3rd trimester -  Patient c/o of vaginal irritation, and occasional discharge  - Due to vaginal irritation, will collect swabs today.  No discharge noted on PE today. Will send treatment pending swabs.   9. Breech lie - Palpated on [place], and verified with BSUS.  - Scheduled ECV with Dr. Lola 04/27/24   Term labor symptoms and general obstetric precautions including but not limited to vaginal bleeding, contractions, leaking of fluid and fetal movement were reviewed in detail with the patient. Please refer to After Visit Summary for other counseling recommendations.   Return in about 1 week (around 04/29/2024) for LOB.  Future Appointments  Date Time Provider Department Center  04/27/2024  8:00 AM [facility] SCHED ROOM  [facility] None  04/27/2024 10:15 AM Vannie Cornell SAUNDERS, CNM [facility]     Camie DELENA Rote, CNM

## 2024-04-25 ENCOUNTER — Telehealth (HOSPITAL_COMMUNITY): Payer: Self-pay | Admitting: *Deleted

## 2024-04-25 ENCOUNTER — Ambulatory Visit: Payer: Self-pay | Admitting: Certified Nurse Midwife

## 2024-04-25 ENCOUNTER — Encounter (HOSPITAL_COMMUNITY): Payer: Self-pay | Admitting: *Deleted

## 2024-04-25 DIAGNOSIS — B3731 Acute candidiasis of vulva and vagina: Secondary | ICD-10-CM

## 2024-04-25 LAB — CERVICOVAGINAL ANCILLARY ONLY
Bacterial Vaginitis (gardnerella): NEGATIVE
Candida Glabrata: NEGATIVE
Candida Vaginitis: POSITIVE — AB
Chlamydia: NEGATIVE
Comment: NEGATIVE
Comment: NEGATIVE
Comment: NEGATIVE
Comment: NEGATIVE
Comment: NEGATIVE
Comment: NORMAL
Neisseria Gonorrhea: NEGATIVE
Trichomonas: NEGATIVE

## 2024-04-25 MED ORDER — FLUCONAZOLE 150 MG PO TABS: 150.0000 mg | ORAL_TABLET | Freq: Once | ORAL | 0 refills | Status: AC

## 2024-04-25 NOTE — Progress Notes (Signed)
 Patient with swabs consistent with vaginal candidiasis.  Please use interpreter to inform of diagnosis and treatment sent to pharmacy.  Vagina, candidiasis - Plan: fluconazole  (DIFLUCAN ) 150 MG tablet  Camie Rote, MSN, CNM, RNC-OB Certified Nurse Midwife, [facility] 04/25/2024 3:52 PM

## 2024-04-25 NOTE — Telephone Encounter (Signed)
 Interpreter number [phone] Preadmission screen

## 2024-04-26 NOTE — Telephone Encounter (Addendum)
 Called patient with in house Spanish interpreter regarding results. Notified patient medication is ready for pick up at her pharmacy. Patient verbalized understanding and denies any other needs at this time  Rosaline, RN  ----- Message from Camie DELENA Rote sent at 04/25/2024  3:53 PM EST ----- Patient with swabs consistent with vaginal candidiasis.  Please use interpreter to inform of diagnosis and treatment sent to pharmacy.  Vagina, candidiasis - Plan: fluconazole  (DIFLUCAN ) 150 MG tablet  Camie Rote, MSN, CNM, RNC-OB Certified Nurse Midwife, [facility] 04/25/2024 3:52 PM  ----- Message ----- From: Interface, Lab In Three Zero Seven Sent: 04/25/2024   3:22 PM EST To: Camie DELENA Rote, CNM

## 2024-04-27 ENCOUNTER — Observation Stay (HOSPITAL_COMMUNITY): Admission: RE | Admit: 2024-04-27 | Source: Ambulatory Visit

## 2024-04-27 ENCOUNTER — Encounter: Admitting: Certified Nurse Midwife

## 2024-04-27 ENCOUNTER — Encounter: Admitting: Obstetrics and Gynecology

## 2024-04-27 ENCOUNTER — Encounter (HOSPITAL_COMMUNITY): Payer: Self-pay | Admitting: Anesthesiology

## 2024-04-27 ENCOUNTER — Observation Stay (HOSPITAL_COMMUNITY)
Admission: RE | Admit: 2024-04-27 | Discharge: 2024-04-27 | Disposition: A | Discharge status: Home / Self Care | Attending: Family Medicine | Admitting: Family Medicine

## 2024-04-27 DIAGNOSIS — O321XX1 Maternal care for breech presentation, fetus 1: Secondary | ICD-10-CM | POA: Diagnosis present

## 2024-04-27 DIAGNOSIS — O321XX0 Maternal care for breech presentation, not applicable or unspecified: Principal | ICD-10-CM | POA: Diagnosis present

## 2024-04-27 DIAGNOSIS — Z3A39 39 weeks gestation of pregnancy: Secondary | ICD-10-CM | POA: Insufficient documentation

## 2024-04-27 NOTE — Progress Notes (Signed)
 Patient presented for ECV.   Found to be vertex on bedside US . Abdominal binder placed.  Discharged to home.

## 2024-05-02 ENCOUNTER — Inpatient Hospital Stay (HOSPITAL_COMMUNITY)
Admission: AD | Admit: 2024-05-02 | Discharge: 2024-05-02 | Disposition: A | Discharge status: Home / Self Care | Payer: Self-pay | Attending: Obstetrics and Gynecology | Admitting: Obstetrics and Gynecology

## 2024-05-02 ENCOUNTER — Encounter (HOSPITAL_COMMUNITY): Payer: Self-pay | Admitting: Obstetrics and Gynecology

## 2024-05-02 DIAGNOSIS — O471 False labor at or after 37 completed weeks of gestation: Secondary | ICD-10-CM | POA: Insufficient documentation

## 2024-05-02 DIAGNOSIS — Z3480 Encounter for supervision of other normal pregnancy, unspecified trimester: Secondary | ICD-10-CM

## 2024-05-02 DIAGNOSIS — Z3A38 38 weeks gestation of pregnancy: Secondary | ICD-10-CM

## 2024-05-02 DIAGNOSIS — B951 Streptococcus, group B, as the cause of diseases classified elsewhere: Secondary | ICD-10-CM

## 2024-05-02 MED ORDER — OXYCODONE-ACETAMINOPHEN 5-325 MG PO TABS
2.0000 | ORAL_TABLET | Freq: Once | ORAL | Status: AC
  Administered 2024-05-02: 2 via ORAL
  Filled 2024-05-02: qty 2

## 2024-05-02 MED ORDER — MORPHINE SULFATE (PF) 4 MG/ML IV SOLN: 4.0000 mg | Freq: Once | INTRAVENOUS | Status: DC

## 2024-05-02 MED ORDER — OXYCODONE-ACETAMINOPHEN 5-325 MG PO TABS: 1.0000 | ORAL_TABLET | Freq: Once | ORAL | 0 refills | Status: DC

## 2024-05-02 MED ORDER — OXYCODONE-ACETAMINOPHEN 5-325 MG PO TABS: 1.0000 | ORAL_TABLET | Freq: Once | ORAL | 0 refills | Status: AC

## 2024-05-02 NOTE — Telephone Encounter (Signed)
 mailed

## 2024-05-02 NOTE — MAU Note (Signed)
..  Ashley Payne is a 33 y.o. at [redacted]w[redacted]d here in MAU reporting: contractions that started over the weekend. She reports that they are now every few minutes. Denies VB or LOF. +FM.   Pain score: 8 Vitals:   05/02/24 1050  BP: (!) 113/59  Pulse: 93  Resp: 16  Temp: 98.2 F (36.8 C)  SpO2: 99%     FHT:140 Lab orders placed from triage:   mau labor

## 2024-05-02 NOTE — MAU Provider Note (Signed)
 Ashley Payne is a 33 y.o. [phone] female at [redacted]w[redacted]d  RN Labor check, seen by provider d/t pain SVE by RN: Dilation: Fingertip Exam by:: Holly Flippin RN NST: FHR baseline 120 bpm, Variability: moderate, Accelerations:present, Decelerations:  Absent= Cat I/Reactive Toco: irregular, every 2-8 minutes  Seen at bedside with [facility] Spanish interpreter, having 8/10 pain with contractions, prefers PO medication. Will give Percocet here in the MAU and discharge with a prescription. Return precautions discussed. Patient's husband will be driving her home, all questions answered prior to discharge. D/C home  Charlie DELENA Courts MD, OB Fellow 05/02/2024 12:58 PM

## 2024-05-10 ENCOUNTER — Inpatient Hospital Stay (HOSPITAL_COMMUNITY)
Admission: AD | Admit: 2024-05-10 | Discharge: 2024-05-12 | DRG: 806 | Disposition: A | Discharge status: Home / Self Care | Attending: Obstetrics and Gynecology | Admitting: Obstetrics and Gynecology

## 2024-05-10 ENCOUNTER — Inpatient Hospital Stay (HOSPITAL_COMMUNITY): Admitting: Anesthesiology

## 2024-05-10 ENCOUNTER — Encounter (HOSPITAL_COMMUNITY): Payer: Self-pay | Admitting: Obstetrics and Gynecology

## 2024-05-10 DIAGNOSIS — O9902 Anemia complicating childbirth: Secondary | ICD-10-CM | POA: Diagnosis present

## 2024-05-10 DIAGNOSIS — Z30017 Encounter for initial prescription of implantable subdermal contraceptive: Secondary | ICD-10-CM | POA: Diagnosis not present

## 2024-05-10 DIAGNOSIS — O34219 Maternal care for unspecified type scar from previous cesarean delivery: Secondary | ICD-10-CM | POA: Diagnosis not present

## 2024-05-10 DIAGNOSIS — O99820 Streptococcus B carrier state complicating pregnancy: Secondary | ICD-10-CM | POA: Diagnosis not present

## 2024-05-10 DIAGNOSIS — Z30430 Encounter for insertion of intrauterine contraceptive device: Secondary | ICD-10-CM | POA: Diagnosis not present

## 2024-05-10 DIAGNOSIS — O99824 Streptococcus B carrier state complicating childbirth: Secondary | ICD-10-CM | POA: Diagnosis present

## 2024-05-10 DIAGNOSIS — O26893 Other specified pregnancy related conditions, third trimester: Secondary | ICD-10-CM | POA: Diagnosis present

## 2024-05-10 DIAGNOSIS — Z603 Acculturation difficulty: Secondary | ICD-10-CM | POA: Diagnosis present

## 2024-05-10 DIAGNOSIS — O34211 Maternal care for low transverse scar from previous cesarean delivery: Secondary | ICD-10-CM | POA: Diagnosis not present

## 2024-05-10 DIAGNOSIS — Z3A39 39 weeks gestation of pregnancy: Secondary | ICD-10-CM

## 2024-05-10 LAB — CBC
HCT: 34.5 % — ABNORMAL LOW (ref 36.0–46.0)
Hemoglobin: 11.4 g/dL — ABNORMAL LOW (ref 12.0–15.0)
MCH: 28.1 pg (ref 26.0–34.0)
MCHC: 33 g/dL (ref 30.0–36.0)
MCV: 85 fL (ref 80.0–100.0)
Platelets: 249 K/uL (ref 150–400)
RBC: 4.06 MIL/uL (ref 3.87–5.11)
RDW: 15.1 % (ref 11.5–15.5)
WBC: 14.1 K/uL — ABNORMAL HIGH (ref 4.0–10.5)
nRBC: 0 % (ref 0.0–0.2)

## 2024-05-10 LAB — TYPE AND SCREEN
ABO/RH(D): O POS
Antibody Screen: NEGATIVE

## 2024-05-10 LAB — SYPHILIS: RPR W/REFLEX TO RPR TITER AND TREPONEMAL ANTIBODIES, TRADITIONAL SCREENING AND DIAGNOSIS ALGORITHM: RPR Ser Ql: NONREACTIVE

## 2024-05-10 MED ORDER — OXYCODONE-ACETAMINOPHEN 5-325 MG PO TABS: 2.0000 | ORAL_TABLET | ORAL | Status: DC | PRN

## 2024-05-10 MED ORDER — HYDROXYZINE HCL 50 MG PO TABS: 50.0000 mg | ORAL_TABLET | Freq: Four times a day (QID) | ORAL | Status: DC | PRN

## 2024-05-10 MED ORDER — SIMETHICONE 80 MG PO CHEW: 80.0000 mg | CHEWABLE_TABLET | ORAL | Status: DC | PRN

## 2024-05-10 MED ORDER — SOD CITRATE-CITRIC ACID 500-334 MG/5ML PO SOLN: 30.0000 mL | ORAL | Status: DC | PRN

## 2024-05-10 MED ORDER — SODIUM CHLORIDE 0.9 % IV SOLN
5.0000 10*6.[IU] | Freq: Once | INTRAVENOUS | Status: AC
  Administered 2024-05-10: 5 10*6.[IU] via INTRAVENOUS
  Filled 2024-05-10: qty 5

## 2024-05-10 MED ORDER — FLEET ENEMA RE ENEM: 1.0000 | ENEMA | RECTAL | Status: DC | PRN

## 2024-05-10 MED ORDER — PHENYLEPHRINE 80 MCG/ML (10ML) SYRINGE FOR IV PUSH (FOR BLOOD PRESSURE SUPPORT)
80.0000 ug | PREFILLED_SYRINGE | INTRAVENOUS | Status: DC | PRN
  Filled 2024-05-10: qty 10

## 2024-05-10 MED ORDER — ACETAMINOPHEN 500 MG PO TABS
1000.0000 mg | ORAL_TABLET | Freq: Four times a day (QID) | ORAL | Status: DC
  Administered 2024-05-10 – 2024-05-12 (×8): 1000 mg via ORAL
  Filled 2024-05-10 (×8): qty 2

## 2024-05-10 MED ORDER — PHENYLEPHRINE 80 MCG/ML (10ML) SYRINGE FOR IV PUSH (FOR BLOOD PRESSURE SUPPORT): 80.0000 ug | PREFILLED_SYRINGE | INTRAVENOUS | Status: DC | PRN

## 2024-05-10 MED ORDER — BENZOCAINE-MENTHOL 20-0.5 % EX AERO: 1.0000 | INHALATION_SPRAY | CUTANEOUS | Status: DC | PRN

## 2024-05-10 MED ORDER — BUPIVACAINE HCL (PF) 0.25 % IJ SOLN
INTRAMUSCULAR | Status: DC | PRN
  Administered 2024-05-10: 1.2 mL via INTRATHECAL

## 2024-05-10 MED ORDER — ONDANSETRON HCL 4 MG/2ML IJ SOLN: 4.0000 mg | Freq: Four times a day (QID) | INTRAMUSCULAR | Status: DC | PRN

## 2024-05-10 MED ORDER — LACTATED RINGERS IV SOLN: 500.0000 mL | Freq: Once | INTRAVENOUS | Status: DC

## 2024-05-10 MED ORDER — WITCH HAZEL-GLYCERIN EX PADS: 1.0000 | MEDICATED_PAD | CUTANEOUS | Status: DC | PRN

## 2024-05-10 MED ORDER — OXYCODONE-ACETAMINOPHEN 5-325 MG PO TABS: 1.0000 | ORAL_TABLET | ORAL | Status: DC | PRN

## 2024-05-10 MED ORDER — IBUPROFEN 600 MG PO TABS
600.0000 mg | ORAL_TABLET | Freq: Four times a day (QID) | ORAL | Status: DC
  Administered 2024-05-10 – 2024-05-12 (×8): 600 mg via ORAL
  Filled 2024-05-10 (×8): qty 1

## 2024-05-10 MED ORDER — EPHEDRINE 5 MG/ML INJ: 10.0000 mg | INTRAVENOUS | Status: DC | PRN

## 2024-05-10 MED ORDER — OXYTOCIN-SODIUM CHLORIDE 30-0.9 UT/500ML-% IV SOLN
2.5000 [IU]/h | INTRAVENOUS | Status: DC
  Filled 2024-05-10: qty 500

## 2024-05-10 MED ORDER — OXYTOCIN BOLUS FROM INFUSION
333.0000 mL | Freq: Once | INTRAVENOUS | Status: AC
  Administered 2024-05-10: 333 mL via INTRAVENOUS

## 2024-05-10 MED ORDER — ONDANSETRON HCL 4 MG/2ML IJ SOLN: 4.0000 mg | INTRAMUSCULAR | Status: DC | PRN

## 2024-05-10 MED ORDER — LACTATED RINGERS IV SOLN: INTRAVENOUS | Status: DC

## 2024-05-10 MED ORDER — ONDANSETRON HCL 4 MG PO TABS: 4.0000 mg | ORAL_TABLET | ORAL | Status: DC | PRN

## 2024-05-10 MED ORDER — LIDOCAINE HCL (PF) 1 % IJ SOLN: 30.0000 mL | INTRAMUSCULAR | Status: DC | PRN

## 2024-05-10 MED ORDER — FENTANYL CITRATE (PF) 100 MCG/2ML IJ SOLN: 100.0000 ug | INTRAMUSCULAR | Status: DC | PRN

## 2024-05-10 MED ORDER — DIBUCAINE (PERIANAL) 1 % EX OINT: 1.0000 | TOPICAL_OINTMENT | CUTANEOUS | Status: DC | PRN

## 2024-05-10 MED ORDER — DIPHENHYDRAMINE HCL 25 MG PO CAPS: 25.0000 mg | ORAL_CAPSULE | Freq: Four times a day (QID) | ORAL | Status: DC | PRN

## 2024-05-10 MED ORDER — TETANUS-DIPHTH-ACELL PERTUSSIS 5-2-15.5 LF-MCG/0.5 IM SUSP: 0.5000 mL | Freq: Once | INTRAMUSCULAR | Status: DC

## 2024-05-10 MED ORDER — DIPHENHYDRAMINE HCL 50 MG/ML IJ SOLN: 12.5000 mg | INTRAMUSCULAR | Status: DC | PRN

## 2024-05-10 MED ORDER — COCONUT OIL OIL: 1.0000 | TOPICAL_OIL | Status: DC | PRN

## 2024-05-10 MED ORDER — FENTANYL-BUPIVACAINE-NACL 0.5-0.125-0.9 MG/250ML-% EP SOLN
12.0000 mL/h | EPIDURAL | Status: DC | PRN
  Administered 2024-05-10: 12 mL/h via EPIDURAL
  Filled 2024-05-10: qty 250

## 2024-05-10 MED ORDER — MEDROXYPROGESTERONE ACETATE 150 MG/ML IM SUSP: 150.0000 mg | INTRAMUSCULAR | Status: DC | PRN

## 2024-05-10 MED ORDER — MAGNESIUM HYDROXIDE 400 MG/5ML PO SUSP: 30.0000 mL | Freq: Every day | ORAL | Status: DC | PRN

## 2024-05-10 MED ORDER — PENICILLIN G POT IN DEXTROSE 60000 UNIT/ML IV SOLN
3.0000 10*6.[IU] | INTRAVENOUS | Status: DC
  Administered 2024-05-10: 3 10*6.[IU] via INTRAVENOUS
  Filled 2024-05-10: qty 50

## 2024-05-10 MED ORDER — SENNOSIDES-DOCUSATE SODIUM 8.6-50 MG PO TABS: 2.0000 | ORAL_TABLET | Freq: Every evening | ORAL | Status: DC | PRN

## 2024-05-10 MED ORDER — ACETAMINOPHEN 325 MG PO TABS: 650.0000 mg | ORAL_TABLET | ORAL | Status: DC | PRN

## 2024-05-10 MED ORDER — CEFAZOLIN SODIUM-DEXTROSE 2-4 GM/100ML-% IV SOLN
2.0000 g | Freq: Once | INTRAVENOUS | Status: AC
  Administered 2024-05-10: 2 g via INTRAVENOUS
  Filled 2024-05-10: qty 100

## 2024-05-10 MED ORDER — MEASLES, MUMPS & RUBELLA VAC ~~LOC~~ SUSR: 0.5000 mL | Freq: Once | SUBCUTANEOUS | Status: DC

## 2024-05-10 MED ORDER — LACTATED RINGERS IV SOLN: 500.0000 mL | INTRAVENOUS | Status: DC | PRN

## 2024-05-10 MED ORDER — PRENATAL MULTIVITAMIN CH
1.0000 | ORAL_TABLET | Freq: Every day | ORAL | Status: DC
  Administered 2024-05-11 – 2024-05-12 (×2): 1 via ORAL
  Filled 2024-05-10 (×2): qty 1

## 2024-05-10 NOTE — H&P (Signed)
 Ashley Payne is a 33 y.o. female [phone] with IUP at [redacted]w[redacted]d by 22 week US  presenting for contractions.  She reports positive fetal movement. She denies leakage of fluid or vaginal bleeding.  Prenatal History/Complications:  CS in [place] for GBS TOLAC 2022 ECV on 04/27/24   PNC at [facility]   Past Medical History: Past Medical History:  Diagnosis Date   Medical history non-contributory    Did NOT proceed with Tubal Ligation on 12/05/21, Disregard TUBAL LIGATION on Surgical History   Normal labor 12/04/2021    Past Surgical History: Past Surgical History:  Procedure Laterality Date   CESAREAN SECTION  2021   NO PAST SURGERIES     TUBAL LIGATION Bilateral 12/05/2021   DID NOT HAVE    Obstetrical History: OB History     Gravida  5   Para  2   Term  2   Preterm      AB  2   Living  2      SAB  2   IAB      Ectopic      Multiple  0   Live Births  2            Social History: Social History   Socioeconomic History   Marital status: Married    Spouse name: Not on file   Number of children: Not on file   Years of education: Not on file   Highest education level: Associate degree: occupational, scientist, product/process development, or vocational program  Occupational History   Not on file  Tobacco Use   Smoking status: Never   Smokeless tobacco: Never  Vaping Use   Vaping status: Never Used  Substance and Sexual Activity   Alcohol use: No   Drug use: Never   Sexual activity: Yes    Birth control/protection: None  Other Topics Concern   Not on file  Social History Narrative   Not on file   Social Drivers of Health   Financial Resource Strain: Low Risk  (04/11/2024)   Overall Financial Resource Strain (CARDIA)    Difficulty of Paying Living Expenses: Not hard at all  Food Insecurity: Patient Declined (04/11/2024)   Hunger Vital Sign    Worried About Programme Researcher, Broadcasting/film/video in the Last Year: Patient declined    Barista in the Last Year: Patient  declined  Transportation Needs: Unknown (04/11/2024)   PRAPARE - Administrator, Civil Service (Medical): No    Lack of Transportation (Non-Medical): Patient declined  Physical Activity: Not on file  Stress: Not on file  Social Connections: Unknown (04/11/2024)   Social Connection and Isolation Panel    Frequency of Communication with Friends and Family: Twice a week    Frequency of Social Gatherings with Friends and Family: Once a week    Attends Religious Services: Not on Marketing Executive or Organizations: No    Attends Engineer, Structural: Not on file    Marital Status: Married    Family History: Family History  Problem Relation Age of Onset   Asthma Mother    Healthy Father    Breast cancer Maternal Grandmother 40 - 49   Cancer Maternal Grandmother    Diabetes Neg Hx    Heart disease Neg Hx    Hypertension Neg Hx     Allergies: No Known Allergies  Medications Prior to Admission  Medication Sig Dispense Refill Last Dose/Taking   ferrous  sulfate 325 (65 FE) MG EC tablet Take 1 tablet (325 mg total) by mouth every other day. 30 tablet 3    ondansetron  (ZOFRAN ) 4 MG tablet Take 1 tablet (4 mg total) by mouth every 8 (eight) hours as needed. 20 tablet 0    Prenatal Vit-Fe Fumarate-FA (PRENATAL VITAMINS PO) Take 1 capsule by mouth daily.       Review of Systems   Constitutional: Negative for fever and chills Eyes: Negative for visual disturbances Respiratory: Negative for shortness of breath, dyspnea Cardiovascular: Negative for chest pain or palpitations  Gastrointestinal: Negative for vomiting, diarrhea and constipation.  POSITIVE for abdominal pain (contractions) Genitourinary: Negative for dysuria and urgency Musculoskeletal: Negative for back pain, joint pain, myalgias  Neurological: Negative for dizziness and headaches  Last menstrual period 07/29/2023, currently breastfeeding. General appearance: alert, cooperative, and no  distress Lungs: normal respiratory effort Heart: regular rate and rhythm Abdomen: soft, non-tender; bowel sounds normal Extremities: Homans sign is negative, no sign of DVT DTR's 2+ Presentation: cephalic Fetal monitoring  Baseline: 140 bpm, Variability: Good {> 6 bpm), Accelerations: Reactive, and Decelerations: Absent occ mild variable Uterine activity  2-3 minutes Dilation: 9 Effacement (%): 80 Station: -1, 0 Exam by:: [employer] RN   Prenatal labs: ABO, Rh: --/--/PENDING (12/09 9447) Antibody: PENDING (12/09 0552) Rubella: 9.35 (05/08 1618) RPR: Non Reactive (09/17 0923)  HBsAg: Negative (05/08 1618)  HIV: Non Reactive (09/17 0923)  GBS: Positive/-- (11/10 1635)   NURSING  PROVIDER  Office Location [place] Dating by LMP c/w U/S at 9.5 wks  [facility] Model Traditional Anatomy U/S   Initiated care at  [employer]  Spanish              LAB RESULTS   Support Person  Genetics NIPS: low risk AFP: neg    NT/IT (FT only)     Carrier Screen Horizon:   Rhogam  O/Positive/-- (05/08 1618) A1C/GTT Early HgbA1C: normal Third trimester 2 hr GTT:   Flu Vaccine 03/02/24    TDaP Vaccine  03/02/24 Blood Type O/Positive/-- (05/08 1618)  RSV Vaccine 03/29/2024 Antibody Negative (05/08 1618)  COVID Vaccine  Rubella 9.35 (05/08 1618)  Feeding Plan breast RPR Non Reactive (05/08 1618)  Contraception Undecided, BTL if having C/S HBsAg Negative (05/08 1618)  Circumcision No HIV Non Reactive (05/08 1618)  Pediatrician  [facility] HCVAb Non Reactive (05/08 1618)  Prenatal Classes No    BTL Consent 10/15 Pap Diagnosis  Date Value Ref Range Status  04/30/2021   Final   - Negative for intraepithelial lesion or malignancy (NILM)    BTL Pre-payment  GC/CT Initial:   36wks:    VBAC Consent 10/15 GBS   For PCN allergy, check sensitivities   BRx Optimized? [ ]  yes   [ ]  no    DME Rx [ ]  BP cuff [ ]  Weight Scale Waterbirth  [ ]  Class [ ]  Consent [ ]  CNM visit  PHQ9 &  GAD7 [  ] new OB [  ] 28 weeks  [  ] 36 weeks Induction  [ ]  Orders Entered [ ] Foley Y/N     Prenatal Transfer Tool  Maternal Diabetes: No Genetic Screening: Normal Maternal Ultrasounds/Referrals: Normal Fetal Ultrasounds or other Referrals:  None Maternal Substance Abuse:  No Significant Maternal Medications:  None Significant Maternal Lab Results: Group B Strep positive Number of Prenatal Visits:greater than 3 verified  prenatal visits Maternal Vaccinations:RSV: Given during pregnancy >/=14 days ago, TDap, and Flu Other Comments:  None  Results for orders placed or performed during the hospital encounter of 05/10/24 (from the past 24 hours)  Type and screen [facility]   Collection Time: 05/10/24  5:52 AM  Result Value Ref Range   ABO/RH(D) PENDING    Antibody Screen PENDING    Sample Expiration      05/13/2024,2359 Performed at [facility], [address], [place], [place] [address]   CBC   Collection Time: 05/10/24  6:07 AM  Result Value Ref Range   WBC 14.1 (H) 4.0 - 10.5 K/uL   RBC 4.06 3.87 - 5.11 MIL/uL   Hemoglobin 11.4 (L) 12.0 - 15.0 g/dL   HCT 65.4 (L) 63.9 - 53.9 %   MCV 85.0 80.0 - 100.0 fL   MCH 28.1 26.0 - 34.0 pg   MCHC 33.0 30.0 - 36.0 g/dL   RDW 84.8 88.4 - 84.4 %   Platelets 249 150 - 400 K/uL   nRBC 0.0 0.0 - 0.2 %    Assessment: Ashley Payne is a 33 y.o. [phone] with an IUP at [redacted]w[redacted]d presenting for active labor  Plan: #Labor: expectant management #Pain:  wants epidural #FWB Cat 1/2 #ID: GBS: PCN  #MOF:  breast #MOC: Nexplanon if SVD, BTL if CS #Circ: no   Cathlean Ely 05/10/2024, 6:40 AM

## 2024-05-10 NOTE — MAU Note (Signed)
 Ashley Payne is a 33 y.o. at [redacted]w[redacted]d here in MAU reporting:  0315 ctxs started 3-5 min apart. No lof, no bleeding. +Fm.  1cm 1 week ago. Desires epidural.  Gbs pos.   Labor and delivery notified about patient and First call.  Pt taken in stretcher due to hurting and having the urge to push. RN with patient.   Pain score: 10/10 lower abdominal pain.    FHT: 149 Lab orders placed from triage: labor eval

## 2024-05-10 NOTE — Lactation Note (Signed)
 This note was copied from a baby's chart. Lactation Consultation Note  Patient Name: Boy Marshawn Ninneman Unijb'd Date: 05/10/2024 Age:33 hours Reason for consult: Initial assessment;Term  P3. Experienced BF mom is BF/formula feeding. Used [employer] for consult. Mom stated she is BF first then giving formula. Suggested if baby doesn't act hungry not to give the formula. Mom stated she doesn't have much milk yet. LC mention small stomach and may not need the formula he may get enough from BF especially if the baby BF more than 10-15 min. Discussed when her mature milk comes in. Newborn feeding habits, behavior, STS, I&O reviewed. Mom has no questions and doesn't need any assistance w/BF. Encouraged if she changes her mind to let us  know.  Maternal Data Has patient been taught Hand Expression?: Yes Does the patient have breastfeeding experience prior to this delivery?: Yes How long did the patient breastfeed?: 1st child 3 months, 2nd child 2 yrs 3 weeks.  Feeding Nipple Type: Slow - flow  LATCH Score Latch: Grasps breast easily, tongue down, lips flanged, rhythmical sucking.  Audible Swallowing: Spontaneous and intermittent  Type of Nipple: Everted at rest and after stimulation  Comfort (Breast/Nipple): Soft / non-tender  Hold (Positioning): No assistance needed to correctly position infant at breast.  LATCH Score: 10   Lactation Tools Discussed/Used    Interventions Interventions: Breast feeding basics reviewed;Hand express;Education;LC Services brochure  Discharge    Consult Status Consult Status: Complete    Simuel Stebner G 05/10/2024, 9:05 PM

## 2024-05-10 NOTE — Anesthesia Postprocedure Evaluation (Signed)
 Anesthesia Post Note  Patient: Ashley Payne  Procedure(s) Performed: AN AD HOC LABOR EPIDURAL     Patient location during evaluation: Mother Baby Anesthesia Type: Spinal Level of consciousness: awake and alert Pain management: pain level controlled Vital Signs Assessment: post-procedure vital signs reviewed and stable Respiratory status: spontaneous breathing, nonlabored ventilation and respiratory function stable Cardiovascular status: stable Postop Assessment: no headache, no backache, no apparent nausea or vomiting, adequate PO intake and able to ambulate Anesthetic complications: no Comments: Right leg still slightly numb   No notable events documented.  Last Vitals:  Vitals:   05/10/24 1412 05/10/24 1535  BP: 110/63 116/74  Pulse: 88 99  Resp: 16 18  Temp: 36.7 C 36.7 C  SpO2: 100% 99%    Last Pain:  Vitals:   05/10/24 2013  TempSrc:   PainSc: 3    Pain Goal:                   Christion Leonhard Adedayo Sydney Hasten

## 2024-05-10 NOTE — Anesthesia Preprocedure Evaluation (Signed)
 Anesthesia Evaluation  Patient identified by MRN, date of birth, ID band Patient awake    Reviewed: Allergy & Precautions, NPO status , Patient's Chart, lab work & pertinent test results  History of Anesthesia Complications Negative for: history of anesthetic complications  Airway Mallampati: III  TM Distance: >3 FB Neck ROM: Full    Dental   Pulmonary neg pulmonary ROS   Pulmonary exam normal breath sounds clear to auscultation       Cardiovascular negative cardio ROS  Rhythm:Regular Rate:Normal     Neuro/Psych negative neurological ROS     GI/Hepatic negative GI ROS, Neg liver ROS,,,  Endo/Other  negative endocrine ROS    Renal/GU negative Renal ROS     Musculoskeletal   Abdominal   Peds  Hematology  (+) Blood dyscrasia, anemia Lab Results      Component                Value               Date                      WBC                      14.1 (H)            05/10/2024                HGB                      11.4 (L)            05/10/2024                HCT                      34.5 (L)            05/10/2024                MCV                      85.0                05/10/2024                PLT                      249                 05/10/2024              Anesthesia Other Findings H/o c-section and VBAC  Reproductive/Obstetrics (+) Pregnancy                              Anesthesia Physical Anesthesia Plan  ASA: 2  Anesthesia Plan: Spinal and Epidural   Post-op Pain Management:    Induction:   PONV Risk Score and Plan:   Airway Management Planned: Natural Airway  Additional Equipment:   Intra-op Plan:   Post-operative Plan:   Informed Consent: I have reviewed the patients History and Physical, chart, labs and discussed the procedure including the risks, benefits and alternatives for the proposed anesthesia with the patient or authorized representative who has  indicated his/her understanding and acceptance.       Plan Discussed with: Anesthesiologist  Anesthesia Plan  Comments: (I have discussed risks of neuraxial anesthesia including but not limited to infection, bleeding, nerve injury, back pain, headache, seizures, and failure of block. Patient denies bleeding disorders and is not currently anticoagulated. Labs have been reviewed. Risks and benefits discussed. All patient's questions answered.  )        Anesthesia Quick Evaluation

## 2024-05-10 NOTE — Anesthesia Procedure Notes (Signed)
 Combined Spinal Epidural Patient location during procedure: OB Start time: 05/10/2024 6:37 AM End time: 05/10/2024 6:42 AM  Staffing Anesthesiologist: Peggye Delon Brunswick, MD Performed: anesthesiologist   Preanesthetic Checklist Completed: patient identified, IV checked, risks and benefits discussed, monitors and equipment checked, pre-op evaluation and timeout performed  Epidural Patient position: sitting Prep: DuraPrep Patient monitoring: heart rate, continuous pulse ox and blood pressure Approach: midline Location: L3-L4 Injection technique: LOR air  Needle:  Needle type: Pencan  Needle gauge: 25 G Needle insertion depth: 5.5 cm Needle type: Tuohy  Needle gauge: 17 G Needle length: 9 cm Needle insertion depth: 5.5 cm Catheter at skin depth: 10 cm Test dose: negative  Assessment Events: blood not aspirated, injection not painful, no injection resistance, no paresthesia and negative IV test  Additional Notes CSE placed for labor pain management. Risks and benefits including, but not limited to, infection, bleeding, local anesthetic toxicity, headache, hypotension, back pain, block failure, etc. were discussed with the patient. The patient expressed understanding and consented to the procedure. I confirmed that the patient has no bleeding disorders and is not taking blood thinners. I confirmed the patient's last platelet count with the nurse. A time-out was performed immediately prior to the procedure. Please see nursing documentation for vital signs. Sterile technique was used throughout the whole procedure. Once LOR achieved, a 25g Pencan spinal needle was passed easily through the Tuohy needle with return of clear CSF. The spinal dose was administered. The spinal needle was withdrawn, and the epidural catheter threaded easily without resistance. Aspiration of the catheter was negative for blood and CSF. An epidural infusion was started.  1 attempt

## 2024-05-10 NOTE — Progress Notes (Signed)
I was present during the delivery, by Orlan Leavens Spanish Medical Interpreter.

## 2024-05-10 NOTE — Discharge Summary (Shared)
 Postpartum Discharge Summary  Date of Service updated      Patient Name: Ashley Payne DOB: 1991-05-21 MRN: [id number]  Date of admission: 05/10/2024 Delivery date:05/10/2024 Delivering provider: PAYNE, SHANTONETTE M Date of discharge: 05/10/2024  Admitting diagnosis: Indication for care in labor or delivery [O75.9] Intrauterine pregnancy: [redacted]w[redacted]d     Secondary diagnosis:  Active Problems:   Indication for care in labor or delivery   Vaginal birth after cesarean  Additional problems: ***    Discharge diagnosis: Term Pregnancy Delivered                                              Post partum procedures:{Postpartum procedures:23558} Augmentation: AROM Complications: {OB Labor/Delivery Complications:20784}  Hospital course: Onset of Labor With Vaginal Delivery      33 y.o. yo [id number] at [redacted]w[redacted]d was admitted in Active Labor on 05/10/2024. Labor course was complicated by NA  Membrane Rupture Time/Date: 10:23 AM,05/10/2024  Delivery Method:Vaginal, Spontaneous Operative Delivery:N/A Episiotomy:   Lacerations:    Patient had a postpartum course complicated by ***.  She is ambulating, tolerating a regular diet, passing flatus, and urinating well. Patient is discharged home in stable condition on 05/10/24.  Newborn Data: Birth date:05/10/2024 Birth time:12:21 PM Gender:Female Living status:Living Apgars:8 ,9  Weight:   Magnesium  Sulfate received: No BMZ received: No Rhophylac:N/A MMR:N/A T-DaP:Given prenatally Flu: Yes RSV Vaccine received: Yes Transfusion:{Transfusion received:30440034}  Immunizations received: Immunization History  Administered Date(s) Administered    sv, Bivalent, Protein Subunit Rsvpref,pf [place]) 03/29/2024   Influenza, Seasonal, Injecte, Preservative Fre 03/06/2023, 03/02/2024   Influenza,inj,Quad PF,6+ Mos 04/30/2021   Tdap 03/02/2024    Physical exam  Vitals:   05/10/24 1101 05/10/24 1233 05/10/24 1246 05/10/24 1256  BP: 131/79 (!)  85/24 113/60 (!) 112/99  Pulse: (!) 109 (!) 129 91   Resp:      Temp:      TempSrc:      SpO2:      Weight:      Height:       General: {Exam; general:21111117} Lochia: {Desc; appropriate/inappropriate:30686::appropriate} Uterine Fundus: {Desc; firm/soft:30687} Incision: {Exam; incision:21111123} DVT Evaluation: {Exam; dvt:2111122} Labs: Lab Results  Component Value Date   WBC 14.1 (H) 05/10/2024   HGB 11.4 (L) 05/10/2024   HCT 34.5 (L) 05/10/2024   MCV 85.0 05/10/2024   PLT 249 05/10/2024      Latest Ref Rng & Units 12/31/2023    3:08 PM  CMP  Glucose 70 - 99 mg/dL 74   BUN 6 - 20 mg/dL 8   Creatinine 9.42 - 8.99 mg/dL 9.49   Sodium 865 - 855 mmol/L 134   Potassium 3.5 - 5.2 mmol/L 4.2   Chloride 96 - 106 mmol/L 99   CO2 20 - 29 mmol/L 20   Calcium  8.7 - 10.2 mg/dL 9.3   Total Protein 6.0 - 8.5 g/dL 6.6   Total Bilirubin 0.0 - 1.2 mg/dL 0.2   Alkaline Phos 44 - 121 IU/L 61   AST 0 - 40 IU/L 13   ALT 0 - 32 IU/L 7    Edinburgh Score:    12/05/2021    7:32 AM  Edinburgh Postnatal Depression Scale Screening Tool  I have been able to laugh and see the funny side of things. 0   I have looked forward with enjoyment to things. 0   I have  blamed myself unnecessarily when things went wrong. 0   I have been anxious or worried for no good reason. 0   I have felt scared or panicky for no good reason. 0   Things have been getting on top of me. 0   I have been so unhappy that I have had difficulty sleeping. 0   I have felt sad or miserable. 0   I have been so unhappy that I have been crying. 0   The thought of harming myself has occurred to me. 0   Edinburgh Postnatal Depression Scale Total 0      Data saved with a previous flowsheet row definition   No data recorded  After visit meds:  Allergies as of 05/10/2024   No Known Allergies   Med Rec must be completed prior to using this [facility]***        Discharge home in stable condition Infant Feeding:  Breast Infant Disposition:home with mother Discharge instruction: per After Visit Summary and Postpartum booklet. Activity: Advance as tolerated. Pelvic rest for 6 weeks.  Diet: routine diet Future Appointments:No future appointments. Follow up Visit:   Please schedule this patient for a In person postpartum visit in 6 weeks with the following provider: Any provider. Additional Postpartum F/U:NA  Low risk pregnancy complicated by: NA Delivery mode:  Vaginal, Spontaneous Anticipated Birth Control:  Plans Interval BTL and Nexplanon   05/10/2024 Paralee JONELLE Carpen, MD

## 2024-05-10 NOTE — Progress Notes (Addendum)
 Labor Progress Note Ashley Payne is a 33 y.o. H4E7977 at [redacted]w[redacted]d presented for SOL S: progressing well.  Epiduralized.  No concerns.   O:  BP 113/67   Pulse (!) 104   Temp 98.6 F (37 C) (Oral)   Resp 18   Ht 5' 3 (1.6 m)   Wt 83.1 kg   LMP 07/29/2023   SpO2 100%   BMI 32.45 kg/m  EFM: 135/mod/+accels, +early decels  CVE: Dilation: 8 Effacement (%): 80 Cervical Position: Middle Station: -2 Presentation: Vertex Exam by:: s. Ashley Payne, cnm   A&P: 33 y.o. H4E7977 [redacted]w[redacted]d presented for SOL #Labor: Progressing well.  Thought to be complete and with bulging bag, so performed AROM at 1023 with return of moderate meconium.  Cervical check after AROM as above-- still with cervix present circumferentially.  Anticipating vaginal delivery.  Will plan to have NICU present at birth given +meconium. #Pain: epiduralized #FWB: cat 2 tracing #GBS positive s/p PCN  Paralee JONELLE Carpen, MD 10:53 AM

## 2024-05-11 LAB — CBC
HCT: 30.2 % — ABNORMAL LOW (ref 36.0–46.0)
Hemoglobin: 10 g/dL — ABNORMAL LOW (ref 12.0–15.0)
MCH: 28 pg (ref 26.0–34.0)
MCHC: 33.1 g/dL (ref 30.0–36.0)
MCV: 84.6 fL (ref 80.0–100.0)
Platelets: 239 K/uL (ref 150–400)
RBC: 3.57 MIL/uL — ABNORMAL LOW (ref 3.87–5.11)
RDW: 15 % (ref 11.5–15.5)
WBC: 15.3 K/uL — ABNORMAL HIGH (ref 4.0–10.5)
nRBC: 0 % (ref 0.0–0.2)

## 2024-05-11 MED ORDER — ETONOGESTREL 68 MG ~~LOC~~ IMPL
68.0000 mg | DRUG_IMPLANT | Freq: Once | SUBCUTANEOUS | Status: AC
  Administered 2024-05-12: 68 mg via SUBCUTANEOUS
  Filled 2024-05-11: qty 1

## 2024-05-11 MED ORDER — LIDOCAINE HCL 1 % IJ SOLN
0.0000 mL | Freq: Once | INTRAMUSCULAR | Status: DC | PRN
  Filled 2024-05-11: qty 20

## 2024-05-11 NOTE — Patient Instructions (Addendum)
 Your appointment with Outpatient Lactation is VIRTUAL: Date:12.16.2025 Time:3:30 pm If you need lactation assistance before your appointment, please call [phone] for lactation voice mail.   Su cita con [facility] es VIRTUAL: Fecha: 16/05/2024 Hora: 3:30 p. m.  Si necesita asistencia con la lactancia antes de su cita, por favor llame al [phone] para dejar un mensaje de voz de market researcher.   ---- [facility] Lactation Support Group  Please join us  for our [facility] [employer] Lactation Support Group at [employer] for Women We meet every Tuesday at 10:00 am to 12:00 pm at [employer] on the second floor in the conference room Lactating parents and lap babies are welcome, no registration is required, if you have a lactation pillow please bring.  Grupo de [place] de [place] de [place]  nase a nuestro Grupo de [place] de Lactancia del [facility]. Nos reunimos todos los martes de 10:00 a. m. a 12:00 p. m. en [address], en el segundo piso, en la sala de conferencias.  Los padres lactantes y los bebs en brazos son bienvenidos; no se requiere inscripcin. Si tiene una almohada de market researcher, por favor trigala.

## 2024-05-11 NOTE — Progress Notes (Addendum)
 POSTPARTUM PROGRESS NOTE Post Partum Day 1  Subjective:  Ashley Payne is a 33 y.o. H4E6976 s/p VBAC at [redacted]w[redacted]d.  No acute events overnight.  Pt denies problems with ambulating, voiding or po intake.  She denies nausea or vomiting.  Pain is moderately controlled.  She has had flatus. She has had bowel movement.  Lochia Minimal.   Objective: Blood pressure 115/72, pulse 78, temperature 98 F (36.7 C), temperature source Oral, resp. rate 18, height 5' 3 (1.6 m), weight 83.1 kg, last menstrual period 07/29/2023, SpO2 99%, unknown if currently breastfeeding.  Physical Exam:  General: alert, cooperative and no distress Chest: no respiratory distress Heart:regular rate, distal pulses intact Abdomen: soft, nontender,  Uterine Fundus: firm, appropriately tender DVT Evaluation: No calf swelling or tenderness Extremities: no LE edema Skin: warm, dry  Recent Labs    05/10/24 0607 05/11/24 0521  HGB 11.4* 10.0*  HCT 34.5* 30.2*    Assessment/Plan: Maggi Sholonda Jobst is a 33 y.o. H4E6976 s/p VBAC at [redacted]w[redacted]d   PPD#1 - Doing well, meeting milestones Contraception: nexplanon - plan to place today.  Discussed risk/benefits. Feeding: breast  Dispo: Plan for discharge PPD2   LOS: 1 day   Glennon Paralee SAUNDERS, MD 05/11/2024, 8:56 AM    Attestation of Supervision of Resident: Evaluation and management procedures were performed by the learners: Family Medicine Resident under my supervision. I was immediately available for direct supervision, assistance and direction throughout this encounter.  I also confirm that I have verified the information documented in the residents note, and that I have also personally reperformed the pertinent components of the physical exam and all of the medical decision making activities.  I have also made any necessary editorial changes.  Barabara Maier, DO FM-OB Fellow [facility] [employer]

## 2024-05-12 ENCOUNTER — Other Ambulatory Visit (HOSPITAL_COMMUNITY): Payer: Self-pay

## 2024-05-12 DIAGNOSIS — Z975 Presence of (intrauterine) contraceptive device: Secondary | ICD-10-CM

## 2024-05-12 DIAGNOSIS — Z30430 Encounter for insertion of intrauterine contraceptive device: Secondary | ICD-10-CM

## 2024-05-12 MED ORDER — SENNOSIDES-DOCUSATE SODIUM 8.6-50 MG PO TABS
2.0000 | ORAL_TABLET | Freq: Every evening | ORAL | 0 refills | Status: AC | PRN
  Filled 2024-05-12: qty 30, 15d supply, fill #0

## 2024-05-12 MED ORDER — ACETAMINOPHEN 500 MG PO TABS
1000.0000 mg | ORAL_TABLET | Freq: Four times a day (QID) | ORAL | 0 refills | Status: AC | PRN
  Filled 2024-05-12: qty 30, 4d supply, fill #0

## 2024-05-12 MED ORDER — IBUPROFEN 600 MG PO TABS
600.0000 mg | ORAL_TABLET | Freq: Four times a day (QID) | ORAL | 0 refills | Status: AC | PRN
  Filled 2024-05-12: qty 30, 8d supply, fill #0

## 2024-05-12 NOTE — Lactation Note (Signed)
 This note was copied from a baby's chart. Lactation Consultation Note  Patient Name: Ashley Payne Date: 05/12/2024 Age:33 hours Reason for consult: Follow-up assessment;Term;Infant weight loss;RN request;Other (Comment) (- 1.83% WL, PPH 621 cc)  Visited with family of 52 100/65 weeks old female Ashley Payne; Ashley Payne is a P3 and experienced breastfeeding. She said her first child was in the NICU and mostly pump and bottle fed for her for 3 months. She voiced this baby is behaving like her first baby, he's not latching consistently and when he does, she doesn't stay long enough. Noticed that baby is being mostly bottle fed, looked at the feeding log, he took 14 bottles of Similac 20 calorie formula between 12-44 ml the last 24 hours.   Couplet is getting discharged today. Reviewed discharge education, and explained safe recommendations for supplementation and encouraged to try the breast first prior to offering a bottle. Asked her to pump whenever Ashley Payne is having a bottle for the onset of secretory activation and the prevention of engorgement. Referral to [facility] H sent for [facility] OP F/U. No other support person at this time. All questions and concerns answered, family to contact [facility] services PRN.  Feeding Mother's Current Feeding Choice: Breast Milk and Formula  Interventions Interventions: Breast feeding basics reviewed;DEBP;Education  Discharge Discharge Education: Engorgement and breast care;Warning signs for feeding baby;Outpatient recommendation;Outpatient Epic message sent Pump: Personal;Hands Free (Mom Cozy wearable)  Consult Status Consult Status: Complete Date: 05/12/24 Follow-up type: Call as needed   Ashley Payne 05/12/2024, 4:23 PM

## 2024-05-12 NOTE — Procedures (Addendum)
 Post-Placental Nexplanon Insertion Procedure Note  Patient was identified. Informed consent was signed, signed copy in chart. A time-out was performed.    The insertion site was identified 8-10 cm (3-4 inches) from the medial epicondyle of the humerus and 3-5 cm (1.25-2 inches) posterior to (below) the sulcus (groove) between the biceps and triceps muscles of the patient's left arm and marked. The site was prepped and draped in the usual sterile fashion. Pt was prepped with alcohol swab and then injected with 3 cc of 1% lidocaine . The site was prepped with betadine. Nexplanon removed form packaging,  Device confirmed in needle, then inserted full length of needle and withdrawn per handbook instructions. Provider and patient verified presence of the implant in the womans arm by palpation. Pt insertion site was covered with steristrips/adhesive bandage and pressure bandage. There was minimal blood loss. Patient tolerated procedure well.  Patient was given post procedure instructions and Nexplanon user card with expiration date. Condoms were recommended for STI prevention. Patient was asked to keep the pressure dressing on for 24 hours to minimize bruising and keep the adhesive bandage on for 3-5 days. The patient verbalized understanding of the plan of care and agrees.   Lot # [id number] Expiration Date 10/2025  Paralee JONELLE Carpen, MD Family Medicine PGY-1  05/12/2024 10:35 AM   Attestation of Supervision of Resident: Evaluation and management procedures were performed by the learners: Family Medicine Resident under my supervision. I was present for direct supervision, assistance and direction throughout this encounter.  I also confirm that I have verified the information documented in the residents note, and that I have also personally reperformed the pertinent components of the physical exam and all of the medical decision making activities.  I have also made any necessary editorial changes.  Barabara Maier, DO FM-OB Fellow [facility] [employer]

## 2024-05-17 ENCOUNTER — Telehealth (HOSPITAL_COMMUNITY): Payer: Self-pay | Admitting: *Deleted

## 2024-05-17 NOTE — Telephone Encounter (Signed)
 05/17/2024  Name: Ashley Payne MRN: [id number] DOB: 01-Mar-1991  Reason for Call:  Transition of Care Hospital Discharge Call  Contact Status: Patient Contact Status: Complete  Language assistant needed: Interpreter Mode: Telephonic Interpreter Interpreter Name: Lawanda BALBOA # [id number]        Follow-Up Questions: Do You Have Any Concerns About Your Health As You Heal From Delivery?: No Do You Have Any Concerns About Your Infants Health?: No  Edinburgh Postnatal Depression Scale:  In the Past 7 Days: I have been able to laugh and see the funny side of things.: As much as I always could I have looked forward with enjoyment to things.: As much as I ever did I have blamed myself unnecessarily when things went wrong.: No, never I have been anxious or worried for no good reason.: No, not at all I have felt scared or panicky for no good reason.: No, not at all Things have been getting on top of me.: No, I have been coping as well as ever I have been so unhappy that I have had difficulty sleeping.: Not at all I have felt sad or miserable.: No, not at all I have been so unhappy that I have been crying.: No, never The thought of harming myself has occurred to me.: Never Edinburgh Postnatal Depression Scale Total: 0  PHQ2-9 Depression Scale:     Discharge Follow-up: Edinburgh score requires follow up?: No  Post-discharge interventions: Reviewed Newborn Safe Sleep Practices  Steva Tammy PEAK  05/17/2024 1911

## 2024-06-02 ENCOUNTER — Encounter: Payer: Self-pay | Admitting: Obstetrics and Gynecology

## 2024-06-03 ENCOUNTER — Other Ambulatory Visit: Payer: Self-pay

## 2024-06-03 MED ORDER — PRENATAL VITAMINS 28-0.8 MG PO TABS: ORAL_TABLET | ORAL | 6 refills | Status: AC

## 2024-06-08 ENCOUNTER — Telehealth: Payer: Self-pay | Admitting: Nurse Practitioner

## 2024-06-08 NOTE — Telephone Encounter (Signed)
 Copied from CRM [id number]. Topic: Appointments - Appointment Cancel/Reschedule >> Jun 08, 2024  1:36 PM Delon DASEN wrote: Patient needs to reschedule appt  to 2/6 in the afternoon- [phone]

## 2024-06-23 ENCOUNTER — Other Ambulatory Visit (HOSPITAL_COMMUNITY)
Admission: RE | Admit: 2024-06-23 | Discharge: 2024-06-23 | Disposition: A | Source: Ambulatory Visit | Attending: Obstetrics and Gynecology | Admitting: Obstetrics and Gynecology

## 2024-06-23 ENCOUNTER — Other Ambulatory Visit: Payer: Self-pay

## 2024-06-23 ENCOUNTER — Ambulatory Visit: Admitting: Obstetrics and Gynecology

## 2024-06-23 VITALS — BP 116/83 | HR 74 | Wt 162.0 lb

## 2024-06-23 DIAGNOSIS — Z124 Encounter for screening for malignant neoplasm of cervix: Secondary | ICD-10-CM | POA: Diagnosis present

## 2024-06-23 DIAGNOSIS — Z758 Other problems related to medical facilities and other health care: Secondary | ICD-10-CM

## 2024-06-23 DIAGNOSIS — O34219 Maternal care for unspecified type scar from previous cesarean delivery: Secondary | ICD-10-CM

## 2024-06-23 DIAGNOSIS — Z975 Presence of (intrauterine) contraceptive device: Secondary | ICD-10-CM

## 2024-06-23 DIAGNOSIS — Z603 Acculturation difficulty: Secondary | ICD-10-CM | POA: Diagnosis not present

## 2024-06-23 NOTE — Progress Notes (Signed)
 "   Post Partum Visit Note  Ashley Payne is a 34 y.o. [phone] female who presents for a postpartum visit. She is 6.2 weeks postpartum following a normal spontaneous vaginal delivery.  I have fully reviewed the prenatal and intrapartum course. The delivery was at 39.5 gestational weeks.  Anesthesia: epidural. Postpartum course has been uncomplicated. Baby is doing well. Baby is feeding by breast. Bleeding staining only. Bowel function is normal. Bladder function is normal. Patient is not sexually active. Contraception method is Nexplanon , was placed postpartum. Postpartum depression screening: negative.   The pregnancy intention screening data noted above was reviewed. Potential methods of contraception were discussed. The patient elected to proceed with No data recorded.   Edinburgh Postnatal Depression Scale - 06/23/24 1545       Edinburgh Postnatal Depression Scale:  In the Past 7 Days   I have been able to laugh and see the funny side of things. 0    I have looked forward with enjoyment to things. 0    I have blamed myself unnecessarily when things went wrong. 0    I have been anxious or worried for no good reason. 0    I have felt scared or panicky for no good reason. 0    Things have been getting on top of me. 0    I have been so unhappy that I have had difficulty sleeping. 0    I have felt sad or miserable. 0    I have been so unhappy that I have been crying. 1    The thought of harming myself has occurred to me. 0    Edinburgh Postnatal Depression Scale Total 1          Health Maintenance Due  Topic Date Due   Hepatitis B Vaccines 19-59 Average Risk (1 of 3 - 19+ 3-dose series) Never done   COVID-19 Vaccine (1 - 2025-26 season) Never done    The following portions of the patient's history were reviewed and updated as appropriate: allergies, current medications, past family history, past medical history, past social history, past surgical history, and problem  list.  Review of Systems Pertinent items are noted in HPI.  Objective:  BP 116/83   Pulse 74   Wt 162 lb (73.5 kg)   LMP 07/29/2023   Breastfeeding Yes   BMI 28.70 kg/m    General:  alert, cooperative, and appears stated age   Breasts:  not indicated  Lungs: Normal respiratory effort  Heart:  Normal heart rate noted  Abdomen: Soft, non-tender   Wound   GU exam:         Assessment:   1. Vaginal birth after cesarean (Primary)  2. Postpartum state Doing well  3. Cervical cancer screening - Cytology - PAP  4. Nexplanon  in place Palpable, no issues thus far Reviewed FDA approved for three years, data showing for longer  5. Language barrier Engineer, structural used   normal postpartum exam.   Plan:   Essential components of care per ACOG recommendations:  1.  Mood and well being: Patient with negative depression screening today. Reviewed local resources for support.  - Patient tobacco use? No.   - hx of drug use? No.    2. Infant care and feeding:  -Patient currently breastmilk feeding? No.  -Social determinants of health (SDOH) reviewed in EPIC. No concerns  3. Sexuality, contraception and birth spacing - Patient does not want a pregnancy in the next year.  - Reviewed reproductive  life planning. Reviewed contraceptive methods based on pt preferences and effectiveness.  Patient desired Hormonal Implant today.    4. Sleep and fatigue -Encouraged family/partner/community support of 4 hours of uninterrupted sleep to help with mood and fatigue  5. Physical Recovery  - Discussed patients delivery and complications. \ - Patient had a Vaginal, no problems at delivery. Perineal healing reviewed. Patient expressed understanding - Patient has urinary incontinence? No. - Patient is safe to resume physical and sexual activity  6.  Health Maintenance - HM due items addressed Yes - Last pap smear  Diagnosis  Date Value Ref Range Status  04/30/2021   Final   -  Negative for intraepithelial lesion or malignancy (NILM)   Pap smear done at today's visit.  -Breast Cancer screening indicated? No.   7. Chronic Disease/Pregnancy Condition follow up: None  - PCP follow up  Burnard CHRISTELLA Moats, MD [facility] for [facility], [facility]  "

## 2024-06-24 LAB — CYTOLOGY - PAP
Comment: NEGATIVE
Diagnosis: NEGATIVE
High risk HPV: NEGATIVE

## 2024-06-27 ENCOUNTER — Ambulatory Visit: Payer: Self-pay | Admitting: Obstetrics and Gynecology

## 2024-06-29 ENCOUNTER — Ambulatory Visit: Admitting: Nurse Practitioner

## 2024-07-08 ENCOUNTER — Ambulatory Visit: Admitting: Nurse Practitioner

## 2024-08-02 ENCOUNTER — Ambulatory Visit: Admitting: Nurse Practitioner
